# Patient Record
Sex: Female | Born: 1960 | Race: White | Hispanic: No | Marital: Married | Smoking: Never smoker
Health system: Southern US, Community
[De-identification: ages and names within clinical notes are randomized; demographics above are authoritative.]

## PROBLEM LIST (undated history)

## (undated) DIAGNOSIS — M1990 Unspecified osteoarthritis, unspecified site: Secondary | ICD-10-CM

## (undated) DIAGNOSIS — F419 Anxiety disorder, unspecified: Secondary | ICD-10-CM

## (undated) DIAGNOSIS — T7840XA Allergy, unspecified, initial encounter: Secondary | ICD-10-CM

## (undated) DIAGNOSIS — F32A Depression, unspecified: Secondary | ICD-10-CM

## (undated) DIAGNOSIS — I4891 Unspecified atrial fibrillation: Secondary | ICD-10-CM

## (undated) DIAGNOSIS — F329 Major depressive disorder, single episode, unspecified: Secondary | ICD-10-CM

## (undated) DIAGNOSIS — H269 Unspecified cataract: Secondary | ICD-10-CM

## (undated) HISTORY — PX: WRIST SURGERY: SHX841

## (undated) HISTORY — DX: Unspecified atrial fibrillation: I48.91

## (undated) HISTORY — PX: SHOULDER SURGERY: SHX246

## (undated) HISTORY — DX: Anxiety disorder, unspecified: F41.9

## (undated) HISTORY — DX: Unspecified osteoarthritis, unspecified site: M19.90

## (undated) HISTORY — DX: Allergy, unspecified, initial encounter: T78.40XA

## (undated) HISTORY — DX: Unspecified cataract: H26.9

## (undated) HISTORY — DX: Major depressive disorder, single episode, unspecified: F32.9

## (undated) HISTORY — DX: Depression, unspecified: F32.A

## (undated) HISTORY — PX: COSMETIC SURGERY: SHX468

## (undated) HISTORY — PX: KIDNEY STONE SURGERY: SHX686

## (undated) HISTORY — PX: FRACTURE SURGERY: SHX138

---

## 1996-11-05 HISTORY — PX: COLON SURGERY: SHX602

## 2000-02-12 ENCOUNTER — Other Ambulatory Visit: Admission: RE | Admit: 2000-02-12 | Discharge: 2000-02-12 | Payer: Self-pay | Admitting: Obstetrics and Gynecology

## 2000-03-08 ENCOUNTER — Other Ambulatory Visit: Admission: RE | Admit: 2000-03-08 | Discharge: 2000-03-08 | Payer: Self-pay | Admitting: Obstetrics and Gynecology

## 2000-03-08 ENCOUNTER — Encounter (INDEPENDENT_AMBULATORY_CARE_PROVIDER_SITE_OTHER): Payer: Self-pay | Admitting: Specialist

## 2001-03-07 ENCOUNTER — Other Ambulatory Visit: Admission: RE | Admit: 2001-03-07 | Discharge: 2001-03-07 | Payer: Self-pay | Admitting: Obstetrics and Gynecology

## 2002-03-17 ENCOUNTER — Other Ambulatory Visit: Admission: RE | Admit: 2002-03-17 | Discharge: 2002-03-17 | Payer: Self-pay | Admitting: Obstetrics and Gynecology

## 2002-07-14 ENCOUNTER — Encounter: Admission: RE | Admit: 2002-07-14 | Discharge: 2002-07-14 | Payer: Self-pay | Admitting: Family Medicine

## 2002-07-14 ENCOUNTER — Encounter: Payer: Self-pay | Admitting: Family Medicine

## 2003-03-23 ENCOUNTER — Other Ambulatory Visit: Admission: RE | Admit: 2003-03-23 | Discharge: 2003-03-23 | Payer: Self-pay | Admitting: Obstetrics and Gynecology

## 2003-12-22 LAB — HM COLONOSCOPY

## 2004-04-04 ENCOUNTER — Other Ambulatory Visit: Admission: RE | Admit: 2004-04-04 | Discharge: 2004-04-04 | Payer: Self-pay | Admitting: Obstetrics and Gynecology

## 2005-04-19 ENCOUNTER — Other Ambulatory Visit: Admission: RE | Admit: 2005-04-19 | Discharge: 2005-04-19 | Payer: Self-pay | Admitting: Obstetrics and Gynecology

## 2005-06-05 HISTORY — PX: BRAIN SURGERY: SHX531

## 2006-04-23 ENCOUNTER — Other Ambulatory Visit: Admission: RE | Admit: 2006-04-23 | Discharge: 2006-04-23 | Payer: Self-pay | Admitting: Obstetrics and Gynecology

## 2007-03-06 HISTORY — PX: OTHER SURGICAL HISTORY: SHX169

## 2007-04-29 ENCOUNTER — Other Ambulatory Visit: Admission: RE | Admit: 2007-04-29 | Discharge: 2007-04-29 | Payer: Self-pay | Admitting: Obstetrics and Gynecology

## 2007-08-26 ENCOUNTER — Other Ambulatory Visit: Admission: RE | Admit: 2007-08-26 | Discharge: 2007-08-26 | Payer: Self-pay | Admitting: Obstetrics and Gynecology

## 2008-12-01 ENCOUNTER — Encounter (INDEPENDENT_AMBULATORY_CARE_PROVIDER_SITE_OTHER): Payer: Self-pay | Admitting: *Deleted

## 2008-12-14 ENCOUNTER — Ambulatory Visit: Payer: Self-pay | Admitting: Family Medicine

## 2008-12-14 DIAGNOSIS — F3289 Other specified depressive episodes: Secondary | ICD-10-CM | POA: Insufficient documentation

## 2008-12-14 DIAGNOSIS — A6000 Herpesviral infection of urogenital system, unspecified: Secondary | ICD-10-CM | POA: Insufficient documentation

## 2008-12-14 DIAGNOSIS — F329 Major depressive disorder, single episode, unspecified: Secondary | ICD-10-CM

## 2008-12-14 DIAGNOSIS — R143 Flatulence: Secondary | ICD-10-CM

## 2008-12-14 DIAGNOSIS — R141 Gas pain: Secondary | ICD-10-CM

## 2008-12-14 DIAGNOSIS — R142 Eructation: Secondary | ICD-10-CM

## 2008-12-14 DIAGNOSIS — F411 Generalized anxiety disorder: Secondary | ICD-10-CM | POA: Insufficient documentation

## 2008-12-15 ENCOUNTER — Ambulatory Visit: Payer: Self-pay | Admitting: Family Medicine

## 2008-12-15 ENCOUNTER — Telehealth: Payer: Self-pay | Admitting: Family Medicine

## 2008-12-17 ENCOUNTER — Ambulatory Visit: Payer: Self-pay | Admitting: Family Medicine

## 2008-12-17 LAB — CONVERTED CEMR LAB: Potassium: 5.4 meq/L — ABNORMAL HIGH (ref 3.5–5.1)

## 2008-12-18 ENCOUNTER — Inpatient Hospital Stay (HOSPITAL_COMMUNITY): Admission: EM | Admit: 2008-12-18 | Discharge: 2008-12-19 | Payer: Self-pay | Admitting: Emergency Medicine

## 2008-12-18 ENCOUNTER — Ambulatory Visit: Payer: Self-pay | Admitting: Endocrinology

## 2008-12-19 ENCOUNTER — Encounter: Payer: Self-pay | Admitting: Endocrinology

## 2008-12-19 DIAGNOSIS — R079 Chest pain, unspecified: Secondary | ICD-10-CM

## 2008-12-21 ENCOUNTER — Ambulatory Visit: Payer: Self-pay

## 2008-12-21 ENCOUNTER — Encounter: Payer: Self-pay | Admitting: Endocrinology

## 2008-12-21 ENCOUNTER — Encounter: Payer: Self-pay | Admitting: Family Medicine

## 2008-12-22 ENCOUNTER — Encounter (INDEPENDENT_AMBULATORY_CARE_PROVIDER_SITE_OTHER): Payer: Self-pay | Admitting: *Deleted

## 2008-12-24 ENCOUNTER — Encounter: Admission: RE | Admit: 2008-12-24 | Discharge: 2008-12-24 | Payer: Self-pay | Admitting: Family Medicine

## 2008-12-24 DIAGNOSIS — R935 Abnormal findings on diagnostic imaging of other abdominal regions, including retroperitoneum: Secondary | ICD-10-CM

## 2008-12-27 ENCOUNTER — Telehealth (INDEPENDENT_AMBULATORY_CARE_PROVIDER_SITE_OTHER): Payer: Self-pay | Admitting: *Deleted

## 2008-12-27 ENCOUNTER — Encounter (INDEPENDENT_AMBULATORY_CARE_PROVIDER_SITE_OTHER): Payer: Self-pay | Admitting: *Deleted

## 2008-12-28 ENCOUNTER — Ambulatory Visit: Payer: Self-pay | Admitting: Gynecology

## 2009-01-03 ENCOUNTER — Ambulatory Visit: Payer: Self-pay | Admitting: Cardiology

## 2009-01-03 ENCOUNTER — Encounter: Payer: Self-pay | Admitting: Cardiology

## 2009-01-04 DIAGNOSIS — R002 Palpitations: Secondary | ICD-10-CM | POA: Insufficient documentation

## 2009-01-14 ENCOUNTER — Ambulatory Visit: Payer: Self-pay | Admitting: Family Medicine

## 2009-01-14 ENCOUNTER — Telehealth (INDEPENDENT_AMBULATORY_CARE_PROVIDER_SITE_OTHER): Payer: Self-pay | Admitting: *Deleted

## 2009-01-19 ENCOUNTER — Telehealth (INDEPENDENT_AMBULATORY_CARE_PROVIDER_SITE_OTHER): Payer: Self-pay | Admitting: *Deleted

## 2009-04-14 ENCOUNTER — Encounter: Payer: Self-pay | Admitting: Gynecology

## 2009-04-14 ENCOUNTER — Other Ambulatory Visit: Admission: RE | Admit: 2009-04-14 | Discharge: 2009-04-14 | Payer: Self-pay | Admitting: Gynecology

## 2009-04-14 ENCOUNTER — Ambulatory Visit: Payer: Self-pay | Admitting: Gynecology

## 2009-04-15 ENCOUNTER — Encounter: Payer: Self-pay | Admitting: Gynecology

## 2009-04-15 ENCOUNTER — Ambulatory Visit: Payer: Self-pay | Admitting: Gynecology

## 2009-04-15 ENCOUNTER — Ambulatory Visit (HOSPITAL_BASED_OUTPATIENT_CLINIC_OR_DEPARTMENT_OTHER): Admission: RE | Admit: 2009-04-15 | Discharge: 2009-04-15 | Payer: Self-pay | Admitting: Gynecology

## 2009-04-19 ENCOUNTER — Ambulatory Visit: Payer: Self-pay | Admitting: Family Medicine

## 2009-07-29 ENCOUNTER — Encounter: Payer: Self-pay | Admitting: Family Medicine

## 2009-07-29 DIAGNOSIS — K5190 Ulcerative colitis, unspecified, without complications: Secondary | ICD-10-CM | POA: Insufficient documentation

## 2009-09-23 ENCOUNTER — Encounter: Payer: Self-pay | Admitting: Family Medicine

## 2009-09-23 DIAGNOSIS — I491 Atrial premature depolarization: Secondary | ICD-10-CM | POA: Insufficient documentation

## 2009-09-23 DIAGNOSIS — I471 Supraventricular tachycardia, unspecified: Secondary | ICD-10-CM | POA: Insufficient documentation

## 2009-10-07 ENCOUNTER — Ambulatory Visit: Payer: Self-pay | Admitting: Family Medicine

## 2009-10-07 DIAGNOSIS — B373 Candidiasis of vulva and vagina: Secondary | ICD-10-CM

## 2009-10-07 DIAGNOSIS — R7309 Other abnormal glucose: Secondary | ICD-10-CM | POA: Insufficient documentation

## 2009-10-10 LAB — CONVERTED CEMR LAB
Calcium: 9.1 mg/dL (ref 8.4–10.5)
Creatinine, Ser: 0.7 mg/dL (ref 0.4–1.2)
GFR calc non Af Amer: 94.96 mL/min (ref >60)
Hgb A1c MFr Bld: 5.4 % (ref 4.6–6.5)
Sodium: 141 meq/L (ref 135–145)

## 2010-04-14 ENCOUNTER — Ambulatory Visit: Payer: Self-pay | Admitting: Family Medicine

## 2010-04-19 LAB — CONVERTED CEMR LAB: Pap Smear: NORMAL

## 2010-04-19 LAB — HM PAP SMEAR

## 2010-04-27 ENCOUNTER — Other Ambulatory Visit: Admission: RE | Admit: 2010-04-27 | Discharge: 2010-04-27 | Payer: Self-pay | Admitting: Gynecology

## 2010-04-27 ENCOUNTER — Encounter: Payer: Self-pay | Admitting: Family Medicine

## 2010-04-27 ENCOUNTER — Ambulatory Visit: Payer: Self-pay | Admitting: Gynecology

## 2010-10-13 ENCOUNTER — Ambulatory Visit: Payer: Self-pay | Admitting: Family Medicine

## 2010-10-13 ENCOUNTER — Encounter: Payer: Self-pay | Admitting: Family Medicine

## 2010-10-13 LAB — CONVERTED CEMR LAB
Blood in Urine, dipstick: NEGATIVE
Glucose, Urine, Semiquant: NEGATIVE
Protein, U semiquant: NEGATIVE
WBC Urine, dipstick: NEGATIVE
pH: 5

## 2010-10-16 LAB — CONVERTED CEMR LAB
Alkaline Phosphatase: 60 units/L (ref 39–117)
BUN: 10 mg/dL (ref 6–23)
Basophils Absolute: 0 10*3/uL (ref 0.0–0.1)
Bilirubin, Direct: 0.1 mg/dL (ref 0.0–0.3)
CO2: 24 meq/L (ref 19–32)
Calcium: 9.7 mg/dL (ref 8.4–10.5)
Chloride: 106 meq/L (ref 96–112)
Cholesterol: 167 mg/dL (ref 0–200)
Creatinine, Ser: 0.8 mg/dL (ref 0.4–1.2)
Eosinophils Absolute: 0.2 10*3/uL (ref 0.0–0.7)
Glucose, Bld: 92 mg/dL (ref 70–99)
HDL: 75.6 mg/dL (ref >39.00)
Hgb A1c MFr Bld: 5.5 % (ref 4.6–6.5)
Lymphocytes Relative: 20.9 % (ref 12.0–46.0)
MCHC: 34.3 g/dL (ref 30.0–36.0)
MCV: 93.6 fL (ref 78.0–100.0)
Monocytes Absolute: 0.4 10*3/uL (ref 0.1–1.0)
Neutrophils Relative %: 71.2 % (ref 43.0–77.0)
Platelets: 262 10*3/uL (ref 150.0–400.0)
RDW: 12.9 % (ref 11.5–14.6)
Total CHOL/HDL Ratio: 2
Total Protein: 7.3 g/dL (ref 6.0–8.3)
Triglycerides: 53 mg/dL (ref 0.0–149.0)

## 2010-12-03 LAB — CONVERTED CEMR LAB
ALT: 30 units/L (ref 0–35)
Basophils Absolute: 0 10*3/uL (ref 0.0–0.1)
Basophils Relative: 0.1 % (ref 0.0–3.0)
Bilirubin, Direct: 0.1 mg/dL (ref 0.0–0.3)
CO2: 26 meq/L (ref 19–32)
Calcium: 10 mg/dL (ref 8.4–10.5)
Cholesterol: 183 mg/dL (ref 0–200)
Creatinine, Ser: 1 mg/dL (ref 0.4–1.2)
GFR calc Af Amer: 76 mL/min
Glucose, Bld: 104 mg/dL — ABNORMAL HIGH (ref 70–99)
HCT: 43.1 % (ref 36.0–46.0)
Hemoglobin: 14.8 g/dL (ref 12.0–15.0)
Lymphocytes Relative: 21.5 % (ref 12.0–46.0)
MCHC: 34.4 g/dL (ref 30.0–36.0)
Monocytes Absolute: 0.4 10*3/uL (ref 0.1–1.0)
Monocytes Relative: 5.1 % (ref 3.0–12.0)
Neutro Abs: 6.3 10*3/uL (ref 1.4–7.7)
RBC: 4.58 M/uL (ref 3.87–5.11)
RDW: 12.6 % (ref 11.5–14.6)
Sodium: 145 meq/L (ref 135–145)
TSH: 1.56 microintl units/mL (ref 0.35–5.50)
Total Protein: 7.6 g/dL (ref 6.0–8.3)
Triglycerides: 65 mg/dL (ref 0–149)

## 2010-12-07 NOTE — Assessment & Plan Note (Signed)
Summary: cpx & lab/cbs   Vital Signs:  Patient profile:   50 year old female Height:      6.25 inches Weight:      143.8 pounds Temp:     99.0 degrees F oral Pulse rate:   78 / minute Pulse rhythm:   regular BP sitting:   128 / 86  (left arm) Cuff size:   regular  Vitals Entered By: Almeta Monas CMA Duncan Dull) (October 13, 2010 9:00 AM)  Serial Vital Signs/Assessments:  Time      Position  BP       Pulse  Resp  Temp     By 9:12 AM             122/76                         Almeta Monas CMA (AAMA)  CC: CPX/fasting, no pap needed, mammogram scheduled for today   History of Present Illness: Pt here for cpe and labs.   Mammogram to be done today.   Pap done by Dr Audie Box.    Preventive Screening-Counseling & Management  Alcohol-Tobacco     Alcohol drinks/day: <1     Alcohol type: 2 glasses of wine 3 times week     Smoking Status: never     Passive Smoke Exposure: yes  Caffeine-Diet-Exercise     Caffeine use/day: 2     Does Patient Exercise: yes     Type of exercise: treadmill     Times/week: 3  Current Medications (verified): 1)  Jolessa 0.15-0.03 Mg Tabs (Levonorgest-Eth Estrad 91-Day) .... As Directed 2)  Klonopin 1 Mg Tabs (Clonazepam) .... 1/2 -1 By Mouth At Bedtime 3)  Cymbalta 30 Mg Cpep (Duloxetine Hcl) .Marland Kitchen.. 1 By Mouth Once Daily 4)  Wellbutrin Xl 300 Mg Xr24h-Tab (Bupropion Hcl) .Marland Kitchen.. 1 By Mouth Once Daily 5)  Multivitamins  Tabs (Multiple Vitamin) 6)  Calcium 1500 Mg Tabs (Calcium Carbonate) 7)  Zango Juice .Marland Kitchen.. 1 Ounce Per Day 8)  Atenolol 25 Mg Tabs (Atenolol) .... As Needed 9)  Flagyl 250 Mg Tabs (Metronidazole) .Marland Kitchen.. 1 By Mouth Once Daily For Pouchitis  Allergies (verified): 1)  ! Codeine 2)  ! Vicodin  Past History:  Past Medical History: Last updated: 12/14/2008 problems with colon bowel problems ulcerative colitis breast augmentation in august 2006 Depression  Past Surgical History: Last updated: 12/14/2008 1998-colon removed May  1998-iliostemy June 1998-emergency surgery had adhesions wrapped around small intestines  Family History: Last updated: 12/14/2008 Father passed in 2000- multiple myeloma palchitis-patient inflammation of small intestines mother-stage 4 ovarian cancer, suffers from DDD, emphysema, COPD, hypothyroidism, tendonitis, HBP, morbidly obese, depression, diabetes, anxiety,chronic pain  Social History: Last updated: 12/14/2008 Occupation: just lost her job-unemployed Divorced Never Smoked Alcohol use-yes Drug use-no Regular exercise-yes  Risk Factors: Alcohol Use: <1 (10/13/2010) Caffeine Use: 2 (10/13/2010) Exercise: yes (10/13/2010)  Risk Factors: Smoking Status: never (10/13/2010) Passive Smoke Exposure: yes (10/13/2010)  Family History: Reviewed history from 12/14/2008 and no changes required. Father passed in 2000- multiple myeloma palchitis-patient inflammation of small intestines mother-stage 4 ovarian cancer, suffers from DDD, emphysema, COPD, hypothyroidism, tendonitis, HBP, morbidly obese, depression, diabetes, anxiety,chronic pain  Social History: Reviewed history from 12/14/2008 and no changes required. Occupation: just lost her job-unemployed Divorced Never Smoked Alcohol use-yes Drug use-no Regular exercise-yes  Review of Systems      See HPI General:  Denies chills, fatigue, fever, loss of appetite, malaise, sleep disorder, sweats,  weakness, and weight loss. Eyes:  Denies blurring, discharge, double vision, eye irritation, eye pain, halos, itching, light sensitivity, red eye, vision loss-1 eye, and vision loss-both eyes. ENT:  Denies decreased hearing, difficulty swallowing, ear discharge, earache, hoarseness, nasal congestion, nosebleeds, postnasal drainage, ringing in ears, sinus pressure, and sore throat. CV:  Denies bluish discoloration of lips or nails, chest pain or discomfort, difficulty breathing at night, difficulty breathing while lying down, fainting,  fatigue, leg cramps with exertion, lightheadness, near fainting, palpitations, shortness of breath with exertion, swelling of feet, swelling of hands, and weight gain. Resp:  Denies chest discomfort, chest pain with inspiration, cough, coughing up blood, excessive snoring, hypersomnolence, morning headaches, pleuritic, shortness of breath, sputum productive, and wheezing. GI:  Denies abdominal pain, bloody stools, change in bowel habits, constipation, dark tarry stools, diarrhea, excessive appetite, gas, hemorrhoids, indigestion, loss of appetite, nausea, vomiting, vomiting blood, and yellowish skin color. GU:  Denies abnormal vaginal bleeding, decreased libido, discharge, dysuria, genital sores, hematuria, incontinence, nocturia, urinary frequency, and urinary hesitancy. MS:  Denies joint pain, joint redness, joint swelling, loss of strength, low back pain, mid back pain, muscle aches, muscle , cramps, muscle weakness, stiffness, and thoracic pain. Derm:  Denies changes in color of skin, changes in nail beds, dryness, excessive perspiration, flushing, hair loss, insect bite(s), itching, lesion(s), poor wound healing, and rash. Neuro:  Denies brief paralysis, difficulty with concentration, disturbances in coordination, falling down, headaches, inability to speak, memory loss, numbness, poor balance, seizures, sensation of room spinning, tingling, tremors, visual disturbances, and weakness. Psych:  Denies alternate hallucination ( auditory/visual), anxiety, depression, easily angered, easily tearful, irritability, mental problems, panic attacks, sense of great danger, suicidal thoughts/plans, thoughts of violence, unusual visions or sounds, and thoughts /plans of harming others. Endo:  Denies cold intolerance, excessive hunger, excessive thirst, excessive urination, heat intolerance, polyuria, and weight change. Heme:  Denies abnormal bruising, bleeding, enlarge lymph nodes, fevers, pallor, and skin  discoloration. Allergy:  Denies hives or rash, itching eyes, persistent infections, seasonal allergies, and sneezing.  Physical Exam  General:  Well-developed,well-nourished,in no acute distress; alert,appropriate and cooperative throughout examination Head:  Normocephalic and atraumatic without obvious abnormalities. No apparent alopecia or balding. Eyes:  vision grossly intact, pupils equal, pupils round, pupils reactive to light, and no injection.   Ears:  External ear exam shows no significant lesions or deformities.  Otoscopic examination reveals clear canals, tympanic membranes are intact bilaterally without bulging, retraction, inflammation or discharge. Hearing is grossly normal bilaterally. Nose:  External nasal examination shows no deformity or inflammation. Nasal mucosa are pink and moist without lesions or exudates. Mouth:  Oral mucosa and oropharynx without lesions or exudates.  Teeth in good repair. Neck:  No deformities, masses, or tenderness noted. Chest Wall:  No deformities, masses, or tenderness noted. Breasts:  gyn Lungs:  Normal respiratory effort, chest expands symmetrically. Lungs are clear to auscultation, no crackles or wheezes. Heart:  normal rate and no murmur.   Abdomen:  Bowel sounds positive,abdomen soft and non-tender without masses, organomegaly or hernias noted. Rectal:  gyn Genitalia:  gyn Msk:  normal ROM, no joint tenderness, no joint swelling, no joint warmth, no redness over joints, no joint deformities, no joint instability, and no crepitation.   Pulses:  R and L carotid,radial,femoral,dorsalis pedis and posterior tibial pulses are full and equal bilaterally Extremities:  No clubbing, cyanosis, edema, or deformity noted with normal full range of motion of all joints.   Neurologic:  No cranial nerve deficits noted.  Station and gait are normal. Plantar reflexes are down-going bilaterally. DTRs are symmetrical throughout. Sensory, motor and coordinative  functions appear intact. Skin:  Intact without suspicious lesions or rashes Cervical Nodes:  No lymphadenopathy noted Axillary Nodes:  No palpable lymphadenopathy Psych:  Cognition and judgment appear intact. Alert and cooperative with normal attention span and concentration. No apparent delusions, illusions, hallucinations   Impression & Recommendations:  Problem # 1:  PREVENTIVE HEALTH CARE (ICD-V70.0) check fasting labs ghm utd  Orders: TLB-TSH (Thyroid Stimulating Hormone) (84443-TSH) TLB-A1C / Hgb A1C (Glycohemoglobin) (83036-A1C) UA Dipstick w/o Micro (manual) ([address])  Problem # 2:  HYPERGLYCEMIA (ICD-790.29)  Orders: Venipuncture ([address]) TLB-Lipid Panel (80061-LIPID) TLB-BMP (Basic Metabolic Panel-BMET) (80048-METABOL) TLB-CBC Platelet - w/Differential (85025-CBCD) TLB-Hepatic/Liver Function Pnl (80076-HEPATIC) TLB-TSH (Thyroid Stimulating Hormone) (84443-TSH) TLB-A1C / Hgb A1C (Glycohemoglobin) (83036-A1C) Specimen Handling (99000)  Labs Reviewed: Creat: 0.7 (10/07/2009)     Problem # 3:  ANXIETY STATE, UNSPECIFIED (ICD-300.00) Assessment: Improved  improved---pt would like to wean off meds---- wean off cymbalta first  Her updated medication list for this problem includes:    Klonopin 1 Mg Tabs (Clonazepam) .Marland Kitchen... 1/2 -1 by mouth at bedtime    Cymbalta 30 Mg Cpep (Duloxetine hcl) .Marland Kitchen... 1 by mouth once daily    Wellbutrin Xl 300 Mg Xr24h-tab (Bupropion hcl) .Marland Kitchen... 1 by mouth once daily  Discussed medication use and relaxation techniques.   Complete Medication List: 1)  Jolessa 0.15-0.03 Mg Tabs (Levonorgest-eth estrad 91-day) .... As directed 2)  Klonopin 1 Mg Tabs (Clonazepam) .... 1/2 -1 by mouth at bedtime 3)  Cymbalta 30 Mg Cpep (Duloxetine hcl) .Marland Kitchen.. 1 by mouth once daily 4)  Wellbutrin Xl 300 Mg Xr24h-tab (Bupropion hcl) .Marland Kitchen.. 1 by mouth once daily 5)  Multivitamins Tabs (Multiple vitamin) 6)  Calcium 1500 Mg Tabs (Calcium carbonate) 7)  Zango Juice  .Marland Kitchen.. 1  ounce per day 8)  Atenolol 25 Mg Tabs (Atenolol) .... As needed 9)  Flagyl 250 Mg Tabs (Metronidazole) .Marland Kitchen.. 1 by mouth once daily for pouchitis  Patient Instructions: 1)  Please schedule a follow-up appointment in 6 months .  Prescriptions: KLONOPIN 1 MG TABS (CLONAZEPAM) 1/2 -1 by mouth at bedtime  #30 x 1   Entered and Authorized by:   Loreen Freud DO   Signed by:   Loreen Freud DO on 10/13/2010   Method used:   Print then Give to Patient   RxID:   [id number] WELLBUTRIN XL 300 MG XR24H-TAB (BUPROPION HCL) 1 by mouth once daily  #30 x 5   Entered and Authorized by:   Loreen Freud DO   Signed by:   Loreen Freud DO on 10/13/2010   Method used:   Electronically to        [id number]* (retail)       [address]       [place], [place]  [address]       Ph: [phone] or [phone]       Fax: [phone]   RxID:   [id number] CYMBALTA 30 MG CPEP (DULOXETINE HCL) 1 by mouth once daily  #30 x 2   Entered and Authorized by:   Loreen Freud DO   Signed by:   Loreen Freud DO on 10/13/2010   Method used:   Electronically to        [id number]* (retail)       [address]  [place], [place]  [address]       Ph: [phone] or [phone]       Fax: [phone]   RxID:   [id number]    Orders Added: 1)  Venipuncture [27253] 2)  TLB-Lipid Panel [80061-LIPID] 3)  TLB-BMP (Basic Metabolic Panel-BMET) [80048-METABOL] 4)  TLB-CBC Platelet - w/Differential [85025-CBCD] 5)  TLB-Hepatic/Liver Function Pnl [80076-HEPATIC] 6)  TLB-TSH (Thyroid Stimulating Hormone) [84443-TSH] 7)  TLB-A1C / Hgb A1C (Glycohemoglobin) [83036-A1C] 8)  Est. Patient 40-64 years [99396] 9)  Specimen Handling [99000] 10)  UA Dipstick w/o Micro (manual) [81002]    Flu Vaccine Next Due:  Refused Last PAP:  Normal (03/17/2008 10:58:14 AM) PAP Result Date:  04/19/2010 PAP Result:  normal PAP Next Due:  1  yr    Laboratory Results   Urine Tests   Date/Time Reported: October 13, 2010 10:25 AM   Routine Urinalysis   Color: yellow Appearance: Clear Glucose: negative   (Normal Range: Negative) Bilirubin: negative   (Normal Range: Negative) Ketone: smal (15)   (Normal Range: Negative) Spec. Gravity: 1.025   (Normal Range: 1.003-1.035) Blood: negative   (Normal Range: Negative) pH: 5.0   (Normal Range: 5.0-8.0) Protein: negative   (Normal Range: Negative) Urobilinogen: negative   (Normal Range: 0-1) Nitrite: negative   (Normal Range: Negative) Leukocyte Esterace: negative   (Normal Range: Negative)    Comments: Floydene Flock  October 13, 2010 10:25 AM

## 2010-12-07 NOTE — Assessment & Plan Note (Signed)
Summary: 6 mo. f/u - jr   Vital Signs:  Patient profile:   50 year old female Height:      68 inches Weight:      144 pounds BMI:     21.97 Pulse rate:   82 / minute Pulse rhythm:   regular BP sitting:   122 / 80  (left arm) Cuff size:   regular  Vitals Entered By: Army Fossa CMA (April 14, 2010 3:12 PM) CC: Pt here for 6 month follow up on meds, Depressive symptoms   History of Present Illness:  Depressive symptoms      This is a 50 year old woman who presents with Depressive symptoms.  The patient denies depressed mood, loss of interest/pleasure, significant weight loss, significant weight gain, insomnia, hypersomnia, psychomotor agitation, and psychomotor retardation.  The patient denies fatigue or loss of energy, feelings of worthlessness, diminished concentration, indecisiveness, thoughts of death, thoughts of suicide, suicidal intent, and suicidal plans.  The patient reports the following psychosocial stressors: recent death of a loved one.  Patient's past history includes depression.  The patient denies abnormally elevated mood, abnormally irritable mood, decreased need for sleep, increased talkativeness, distractibility, flight of ideas, increased goal-directed activity, and inflated self-esteem/ grandiosity.    Current Medications (verified): 1)  Jolessa 0.15-0.03 Mg Tabs (Levonorgest-Eth Estrad 91-Day) .... As Directed 2)  Metronidazole 500 Mg Tabs (Metronidazole) .... Takes 1/2 Tablet Daily 3)  Klonopin 1 Mg Tabs (Clonazepam) .... 1/2 -1 By Mouth At Bedtime 4)  Cymbalta 60 Mg Cpep (Duloxetine Hcl) .Marland Kitchen.. 1 By Mouth Once Daily 5)  Wellbutrin Xl 300 Mg Xr24h-Tab (Bupropion Hcl) .Marland Kitchen.. 1 By Mouth Once Daily 6)  Fluconazole 150 Mg Tabs (Fluconazole) .Marland Kitchen.. 1 By Mouth Once Daily X1 --May Repeat in 1 Month As Needed 7)  Multivitamins  Tabs (Multiple Vitamin) 8)  Calcium 1500 Mg Tabs (Calcium Carbonate) 9)  Fish Oil 300 Mg Caps (Omega-3 Fatty Acids)  Allergies: 1)  ! Codeine 2)   ! Vicodin  Past History:  Past medical, surgical, family and social histories (including risk factors) reviewed for relevance to current acute and chronic problems.  Past Medical History: Reviewed history from 12/14/2008 and no changes required. problems with colon bowel problems ulcerative colitis breast augmentation in august 2006 Depression  Past Surgical History: Reviewed history from 12/14/2008 and no changes required. 1998-colon removed May 1998-iliostemy June 1998-emergency surgery had adhesions wrapped around small intestines  Family History: Reviewed history from 12/14/2008 and no changes required. Father passed in 2000- multiple myeloma palchitis-patient inflammation of small intestines mother-stage 4 ovarian cancer, suffers from DDD, emphysema, COPD, hypothyroidism, tendonitis, HBP, morbidly obese, depression, diabetes, anxiety,chronic pain  Social History: Reviewed history from 12/14/2008 and no changes required. Occupation: just lost her job-unemployed Divorced Never Smoked Alcohol use-yes Drug use-no Regular exercise-yes  Review of Systems      See HPI  Physical Exam  General:  Well-developed,well-nourished,in no acute distress; alert,appropriate and cooperative throughout examination Psych:  Oriented X3, normally interactive, good eye contact, not anxious appearing, and not depressed appearing.     Impression & Recommendations:  Problem # 1:  DEPRESSION (ICD-311) Assessment Improved  Her updated medication list for this problem includes:    Klonopin 1 Mg Tabs (Clonazepam) .Marland Kitchen... 1/2 -1 by mouth at bedtime    Cymbalta 60 Mg Cpep (Duloxetine hcl) .Marland Kitchen... 1 by mouth once daily    Wellbutrin Xl 300 Mg Xr24h-tab (Bupropion hcl) .Marland Kitchen... 1 by mouth once daily  Complete Medication List: 1)  Jolessa 0.15-0.03 Mg Tabs (Levonorgest-eth estrad 91-day) .... As directed 2)  Metronidazole 500 Mg Tabs (Metronidazole) .... Takes 1/2 tablet daily 3)  Klonopin 1 Mg  Tabs (Clonazepam) .... 1/2 -1 by mouth at bedtime 4)  Cymbalta 60 Mg Cpep (Duloxetine hcl) .Marland Kitchen.. 1 by mouth once daily 5)  Wellbutrin Xl 300 Mg Xr24h-tab (Bupropion hcl) .Marland Kitchen.. 1 by mouth once daily 6)  Fluconazole 150 Mg Tabs (Fluconazole) .Marland Kitchen.. 1 by mouth once daily x1 --may repeat in 1 month as needed 7)  Multivitamins Tabs (Multiple vitamin) 8)  Calcium 1500 Mg Tabs (Calcium carbonate) 9)  Fish Oil 300 Mg Caps (Omega-3 fatty acids)  Patient Instructions: 1)  Please schedule a follow-up appointment in 6 months for cpe Prescriptions: KLONOPIN 1 MG TABS (CLONAZEPAM) 1/2 -1 by mouth at bedtime  #30 x 1   Entered and Authorized by:   Loreen Freud DO   Signed by:   Loreen Freud DO on 04/14/2010   Method used:   Print then Give to Patient   RxID:   [id number] WELLBUTRIN XL 300 MG XR24H-TAB (BUPROPION HCL) 1 by mouth once daily  #30 x 5   Entered and Authorized by:   Loreen Freud DO   Signed by:   Loreen Freud DO on 04/14/2010   Method used:   Electronically to        [id number]* (retail)       [address]       [place], [place]  [address]       Ph: [phone] or [phone]       Fax: [phone]   RxID:   [id number] CYMBALTA 60 MG CPEP (DULOXETINE HCL) 1 by mouth once daily  #30 x 5   Entered and Authorized by:   Loreen Freud DO   Signed by:   Loreen Freud DO on 04/14/2010   Method used:   Electronically to        [id number] (retail)       [address]       [place], [place]  [address]       Ph: [phone] or [phone]       Fax: [phone]   RxID:   [id number]

## 2010-12-07 NOTE — Letter (Signed)
Summary: Cancer Screening/[facility] Personalized Risk Profile  Cancer Screening/[facility] Personalized Risk Profile   Imported By: Lanelle Bal 10/18/2010 13:01:39  _____________________________________________________________________  External Attachment:    Type:   Image     Comment:   External Document

## 2010-12-07 NOTE — Letter (Signed)
Summary: [facility]  [facility]   Imported By: Lanelle Bal 05/16/2010 09:56:32  _____________________________________________________________________  External Attachment:    Type:   Image     Comment:   External Document

## 2010-12-15 ENCOUNTER — Telehealth (INDEPENDENT_AMBULATORY_CARE_PROVIDER_SITE_OTHER): Payer: Self-pay | Admitting: *Deleted

## 2010-12-19 ENCOUNTER — Encounter: Payer: Self-pay | Admitting: Family Medicine

## 2011-01-02 NOTE — Progress Notes (Signed)
Summary: -Prior Auth APPROVED CYMBALTA COVENTRY  Phone Note Refill Request Call back at [phone] Message from:  Pharmacy on December 15, 2010 2:08 PM  Refills Requested: Medication #1:  CYMBALTA 30 MG CPEP 1 by mouth once daily   Dosage confirmed as above?Dosage Confirmed Prior Auth from [employer] [facility] on [address]  Switch to a preferred. (handwritten note)  Initial call taken by: Harold Barban,  December 15, 2010 2:08 PM  Follow-up for Phone Call        Preferred med: celexa, effexor, lexapro, paxil, prozac. Pls advise if any of these med would be appropriate if not PA will be needed to try to get med approved.Marti Sleigh Deloach CMA  December 18, 2010 5:04 PM   Additional Follow-up for Phone Call Additional follow up Details #1::        none of these is equivalent to cymbalta----ask pt if she has tried any in the past and let her know what ins has sent.  Prior auth if she doen't want to change.   Additional Follow-up by: Loreen Freud DO,  December 19, 2010 9:44 AM    Additional Follow-up for Phone Call Additional follow up Details #2::    Left message to call office .Marland KitchenMarland KitchenFelecia Deloach CMA  December 19, 2010 10:31 AM   Pt states that the only med she has tried was zoloft and it made her very sleepy and it really did not help her symptoms. Advise Pt would try to get PA approved.........Marland KitchenFelecia Deloach CMA  December 19, 2010 1:04 PM   awaiting fax............Marland KitchenFelecia Deloach CMA  December 19, 2010 4:00 PM  [phone]  PA faxed back awaiting response.........Marland KitchenFelecia Deloach CMA  December 19, 2010 4:57 PM   Spoke with PA agent still in process should have decision today.Marti Sleigh Deloach CMA  December 22, 2010 9:57 AM   Prior Auth approved from 12-19-10 until 12-21-12, pharmacy and Pt notified.......Marland KitchenFelecia Deloach CMA  December 25, 2010 9:24 AM

## 2011-01-02 NOTE — Medication Information (Signed)
Summary: PA & Authorization for Cymbalta  PA & Authorization for Cymbalta   Imported By: Maryln Gottron 12/28/2010 09:47:54  _____________________________________________________________________  External Attachment:    Type:   Image     Comment:   External Document

## 2011-01-11 ENCOUNTER — Ambulatory Visit (INDEPENDENT_AMBULATORY_CARE_PROVIDER_SITE_OTHER): Payer: No Typology Code available for payment source | Admitting: Family Medicine

## 2011-01-11 ENCOUNTER — Encounter: Payer: Self-pay | Admitting: Family Medicine

## 2011-01-11 DIAGNOSIS — J309 Allergic rhinitis, unspecified: Secondary | ICD-10-CM

## 2011-01-16 NOTE — Assessment & Plan Note (Signed)
Summary: cold and cough for 11 days, needs more cymbalta samples///sph   Vital Signs:  Patient profile:   50 year old female Height:      62.5 inches (15.88 cm) Weight:      147 pounds (66.82 kg) BMI:     26.55 Temp:     98.5 degrees F (36.94 degrees C) oral BP sitting:   108 / 64  (left arm) Cuff size:   regular  Vitals Entered By: Lucious Groves CMA (January 11, 2011 2:29 PM) CC: C/O cold and cough x11 days./kb Is Patient Diabetic? No Pain Assessment Patient in pain? no      Comments Patient notes that she has been having post nasal drip, scratch throat, and dry cough. Patient notes that the cough causes a "dry tickle". Patient denies fever, HA, SOB, mucous production, and chest pain. Patient also needs samples of Cymbalta due to cost.    History of Present Illness: 50 yo woman here today for cough.  sxs started w/ scratchy throat, PND (allergy sxs).  sxs started 12 days ago.  cough is dry.  keeping pt awake at night.  not taking anything for allergies.  no fevers, ear pain, facial pain/pressure.  Current Medications (verified): 1)  Jolessa 0.15-0.03 Mg Tabs (Levonorgest-Eth Estrad 91-Day) .... As Directed 2)  Klonopin 1 Mg Tabs (Clonazepam) .... 1/2 -1 By Mouth At Bedtime 3)  Cymbalta 30 Mg Cpep (Duloxetine Hcl) .Marland Kitchen.. 1 By Mouth Once Daily 4)  Wellbutrin Xl 300 Mg Xr24h-Tab (Bupropion Hcl) .Marland Kitchen.. 1 By Mouth Once Daily 5)  Multivitamins  Tabs (Multiple Vitamin) 6)  Calcium 1500 Mg Tabs (Calcium Carbonate) 7)  Zango Juice .Marland Kitchen.. 1 Ounce Per Day 8)  Flagyl 250 Mg Tabs (Metronidazole) .Marland Kitchen.. 1 By Mouth Once Daily For Pouchitis  Allergies (verified): 1)  ! Codeine 2)  ! Vicodin  Review of Systems      See HPI  Physical Exam  General:  Well-developed,well-nourished,in no acute distress; alert,appropriate and cooperative throughout examination Head:  NCAT, no TTP over sinuses Eyes:  no injxn or inflammation Ears:  External ear exam shows no significant lesions or deformities.   Otoscopic examination reveals clear canals, tympanic membranes are intact bilaterally without bulging, retraction, inflammation or discharge. Hearing is grossly normal bilaterally. Nose:  mild turbinate edema Mouth:  copious PND Neck:  No deformities, masses, or tenderness noted. Lungs:  Normal respiratory effort, chest expands symmetrically. Lungs are clear to auscultation, no crackles or wheezes.  dry cough Heart:  normal rate and no murmur.     Impression & Recommendations:  Problem # 1:  RHINITIS (ICD-477.9) Assessment New pt's cough is most likely due to untreated seasonal allergies.  start nasal spray, OTC antihistamine.  reviewed supportive care and red flags that should prompt return.  Pt expresses understanding and is in agreement w/ this plan. Her updated medication list for this problem includes:    Fluticasone Propionate 50 Mcg/act Susp (Fluticasone propionate) .Marland Kitchen... 2 sprays each nostril once daily  Complete Medication List: 1)  Jolessa 0.15-0.03 Mg Tabs (Levonorgest-eth estrad 91-day) .... As directed 2)  Klonopin 1 Mg Tabs (Clonazepam) .... 1/2 -1 by mouth at bedtime 3)  Cymbalta 30 Mg Cpep (Duloxetine hcl) .Marland Kitchen.. 1 by mouth once daily 4)  Wellbutrin Xl 300 Mg Xr24h-tab (Bupropion hcl) .Marland Kitchen.. 1 by mouth once daily 5)  Multivitamins Tabs (Multiple vitamin) 6)  Calcium 1500 Mg Tabs (Calcium carbonate) 7)  Zango Juice  .Marland Kitchen.. 1 ounce per day 8)  Flagyl 250  Mg Tabs (Metronidazole) .Marland Kitchen.. 1 by mouth once daily for pouchitis 9)  Fluticasone Propionate 50 Mcg/act Susp (Fluticasone propionate) .... 2 sprays each nostril once daily 10)  Tessalon 200 Mg Caps (Benzonatate) .... Take one capsule by mouth three times a day as needed for cough  Patient Instructions: 1)  This appears to be a post nasal drip cough 2)  Start the nasal spray as directed to decrease the drainage and in turn, the cough 3)  Add OTC Claritin or Zyrtec as needed for your allergy symptoms 4)  Use the Tessalon for  cough relief 5)  Drink plenty of fluids 6)  Hang in there!!! Prescriptions: TESSALON 200 MG CAPS (BENZONATATE) Take one capsule by mouth three times a day as needed for cough  #60 x 0   Entered and Authorized by:   Neena Rhymes MD   Signed by:   Neena Rhymes MD on 01/11/2011   Method used:   Electronically to        [facility]  [address] [id number]* (retail)       [address]       [place], [place]  [address]       Ph: [phone] or [phone]       Fax: [phone]   RxID:   [id number] FLUTICASONE PROPIONATE 50 MCG/ACT  SUSP (FLUTICASONE PROPIONATE) 2 sprays each nostril once daily  #1 vial x 3   Entered and Authorized by:   Neena Rhymes MD   Signed by:   Neena Rhymes MD on 01/11/2011   Method used:   Electronically to        [facility]  [address] [id number]* (retail)       [address]       [place], [place]  [address]       Ph: [phone] or [phone]       Fax: [phone]   RxID:   [id number]    Orders Added: 1)  Est. Patient Level III [17616]

## 2011-02-20 LAB — CBC
Hemoglobin: 14.3 g/dL (ref 12.0–15.0)
MCHC: 35.1 g/dL (ref 30.0–36.0)
RBC: 4.44 MIL/uL (ref 3.87–5.11)
WBC: 10.6 10*3/uL — ABNORMAL HIGH (ref 4.0–10.5)

## 2011-02-20 LAB — CARDIAC PANEL(CRET KIN+CKTOT+MB+TROPI)
CK, MB: 0.7 ng/mL (ref 0.3–4.0)
Relative Index: INVALID (ref 0.0–2.5)
Total CK: 67 U/L (ref 7–177)
Troponin I: <0.01 ng/mL (ref 0.00–0.06)

## 2011-02-20 LAB — D-DIMER, QUANTITATIVE: D-Dimer, Quant: <0.22 ug/mL-FEU (ref 0.00–0.48)

## 2011-02-20 LAB — POCT CARDIAC MARKERS
Myoglobin, poc: 43.4 ng/mL (ref 12–200)
Troponin i, poc: <0.05 ng/mL (ref 0.00–0.09)

## 2011-02-20 LAB — BASIC METABOLIC PANEL
CO2: 23 mEq/L (ref 19–32)
Calcium: 9.5 mg/dL (ref 8.4–10.5)
Chloride: 108 mEq/L (ref 96–112)
Creatinine, Ser: 0.75 mg/dL (ref 0.4–1.2)
GFR calc Af Amer: >60 mL/min (ref >60)
Sodium: 141 mEq/L (ref 135–145)

## 2011-02-20 LAB — TROPONIN I: Troponin I: <0.01 ng/mL (ref 0.00–0.06)

## 2011-02-20 LAB — CK TOTAL AND CKMB (NOT AT ARMC): Relative Index: INVALID (ref 0.0–2.5)

## 2011-03-20 NOTE — Discharge Summary (Signed)
[name], [name]                 ACCOUNT NO.:  [id number]   MEDICAL RECORD NO.:  [id number]          PATIENT TYPE:  INP   LOCATION:  3705                         FACILITY:  [facility]   PHYSICIAN:  Sean A. Everardo All, MD    DATE OF BIRTH:  [birth year]   DATE OF ADMISSION:  12/18/2008  DATE OF DISCHARGE:  12/19/2008                               DISCHARGE SUMMARY   REASON FOR ADMISSION:  Chest pain.   HISTORY OF PRESENT ILLNESS:  A 50 year old woman whom I admitted on  December 18, 2008, with chest pain.  Please refer to my dictated history  and physical for details.   HOSPITAL COURSE:  The patient was admitted and during her admission had  which she described as only a very tiny bit of chest pain.  D-dimer and  serial cardiac enzymes were negative.  On December 19, 2008, she was  alert, oriented, ambulatory, and pain-free.  We discussed the treatment  options to include inpatient cardiac nuclear medicine study.  I offered  this to her, and she declined saying she wished to go home.  This will  be done as an outpatient in the next few days.  We discussed the fact  that this is a low risk, but not no risk situation.   DIAGNOSES AT THE TIME OF DISCHARGE:  Same as on admission.   MEDICATIONS:  1. Sublingual nitroglycerin 0.4 mg every 5 minutes as needed for chest      pain.  2. Aspirin 81 mg a day.  3. Otherwise, same as on admission.   FOLLOWUP:  The patient will be contacted for cardiac nuclear study in  the next few days, no restriction on diet, and she is told to increase  her activity slowly.      Sean A. Everardo All, MD  Electronically Signed     SAE/MEDQ  D:  12/19/2008  T:  12/19/2008  Job:  [id number]

## 2011-03-20 NOTE — H&P (Signed)
[name], [name]                 ACCOUNT NO.:  [id number]   MEDICAL RECORD NO.:  [id number]          PATIENT TYPE:  INP   LOCATION:  3705                         FACILITY:  [facility]   PHYSICIAN:  Sean A. Everardo All, MD    DATE OF BIRTH:  05/26/1961   DATE OF ADMISSION:  12/18/2008  DATE OF DISCHARGE:                              HISTORY & PHYSICAL   REASON FOR ADMISSION:  Chest pain.   HISTORY OF PRESENT ILLNESS:  A 50 year old woman who was seen 6 days ago  for an episode of palpitations.  The patient says this was determined to  be due to an allergic reaction to eating tuna.  She now has  approximately 8 hours of intermittent mild chest pain across the  anterior chest wall.  She describes it as a band-like sensation.  It was  improved with nitroglycerin here in the emergency room.  No associated  symptoms.   PAST MEDICAL HISTORY:  1. Anxiety/depression.  2. Ulcerative colitis for which she takes chronic suppressive Flagyl.  3. She is not a smoker.  She drinks alcohol 2-3 drinks, about 3 times      a week.  4. She was recently found to have a high potassium of 6.1 and she came      back for a recheck yesterday that was 5.4 in the office.   FAMILY HISTORY:  Negative for heart disease at a young age.   SOCIAL HISTORY:  She is recently separated after a long marriage.  She  also recently lost her job.   REVIEW OF SYSTEMS:  She says she gained 30 pounds in the past year.  She  denies the following:  Fever, headache, syncope, sore throat, earache,  shortness of breath, abdominal pain, rectal bleeding, hematuria,  dysuria, and skin rash.  She has occasional mild arthralgias.   PHYSICAL EXAMINATION:  Vital Signs:  Blood pressure 123/73, heart rate  is 70, respiratory rate 20, and temperature is 98.3.  GENERAL:  Healthy-appearing woman resting comfortably.  SKIN:  No rash, not diaphoretic.  HEENT:  No proptosis.  No periorbital swelling.  Pharynx is normal.  NECK:  Supple.  CHEST:   Clear to auscultation.  No respiratory distress.  The chest wall  is nontender.  CARDIOVASCULAR:  No JVD.  No edema.  Regular rate and rhythm.  No  murmur.  Pedal pulses are intact and there is no bruit at the carotid  arteries.  ABDOMEN:  Soft, nontender.  No hepatosplenomegaly, no mass.  No hernia.  Breasts, gynecological, and rectal examination not done at this time due  to patient's condition.  EXTREMITIES:  I do not appreciate any osteoarthritic changes.  NEUROLOGIC:  She is alert and well oriented.  She does not appear  anxious nor depressed at the time of my visit.  Cranial nerves appeared  to be intact.  She readily moves all fours.   LABORATORY STUDIES:  To my reading, electrocardiogram is normal x2 in  the emergency room.  Initial cardiac enzymes are negative.  Potassium  4.0.  Chest x-ray, no acute disease.  CBC  and BMET are normal.   IMPRESSION:  1. Chest pain of uncertain etiology, possibly due to      anxiety/depression.  2. Anxiety/depression.  3. Hyperkalemia, uncertain etiology, resolved.  4. Other chronic medical problems as noted above.   PLAN:  1. Admit to any telemetry bed.  2. Finish 3 rounds of cardiac enzymes.  3. Symptomatic therapy.  4. All discussed the case with cardiology in the morning to determine      what studies she would need next, and at what time frame.  5. Given that her potassium is normalized, I do not see that she needs      any follow up now.  6. As far as I am concerned, she can ambulate now which is important      because she is on oral contraceptives.      Sean A. Everardo All, MD  Electronically Signed     SAE/MEDQ  D:  12/18/2008  T:  12/19/2008  Job:  [id number]

## 2011-03-20 NOTE — Op Note (Signed)
[name], [name]                 ACCOUNT NO.:  [id number]   MEDICAL RECORD NO.:  [id number]          PATIENT TYPE:  AMB   LOCATION:  [facility]                         FACILITY:  [facility]   PHYSICIAN:  Timothy P. Fontaine, M.D.DATE OF BIRTH:  1961/06/22   DATE OF PROCEDURE:  04/15/2009  DATE OF DISCHARGE:                               OPERATIVE REPORT   PREOPERATIVE DIAGNOSES:  Endometrial polyp.   POSTOPERATIVE DIAGNOSES:  Endometrial polyp.   PROCEDURE:  Hysteroscopy, polypectomy, dilatation and curettage.   SURGEON:  Dr. Audie Box.   ANESTHETIC:  General with 1% lidocaine paracervical block.   SPECIMEN:  1. Endometrial curetting.  2. Endometrial polyp.   ESTIMATED BLOOD LOSS:  Minimal.   SORBITOL DISCREPANCY:  Minimal.   COMPLICATIONS:  None.   FINDINGS:  EUA:  External BUS, vagina normal.  Cervix normal.  Uterus  normal size, shape and contour, midline and mobile.  Adnexa without  masses.  Hysteroscopic with a single polyp posterior lower uterine  segment excised at its base.  Otherwise endometrial cavity was normal  noting fundus, right and left tubal ostia, anterior and posterior  uterine surfaces, lower uterine segment and endocervical canal all  visualized.   PROCEDURE:  The patient was taken to the operating room, having voided  on the way to the operating room, was placed in the supine position,  underwent general anesthesia, and was placed in low dorsal lithotomy  position.  The patient received a vaginal perineal preparation with  Betadine solution.  EUA performed, and the patient was draped in usual  fashion.  The cervix was visualized with a weighted speculum.  Anterior  lip grasped with single-tooth tenaculum, and the cervix was gently  gradually dilated to admit the operative hysteroscope.  Hysteroscopy was  performed with findings noted above.  Using the right-angle resectoscope  loop, the polyp was excised at its base at the level of the surrounding  endometrium and sent to pathology intact.  A sharp curettage was then  performed, and again the specimen was sent to pathology.  Rehysteroscopy  showed an empty cavity, good distention, no evidence of  perforation.  The instruments were removed.  Hemostasis visualized.  The  patient placed in the supine position, received intraoperative Toradol,  was awakened without difficulty, taken to recovery room in good  condition, having tolerated the procedure well.      Timothy P. Fontaine, M.D.  Electronically Signed     TPF/MEDQ  D:  04/15/2009  T:  04/15/2009  Job:  [id number]

## 2011-03-20 NOTE — H&P (Signed)
NAMEHELMA, [name]                 ACCOUNT NO.:  [id number]   MEDICAL RECORD NO.:  [id number]          PATIENT TYPE:  AMB   LOCATION:  [facility]                         FACILITY:  [facility]   PHYSICIAN:  Timothy P. Fontaine, M.D.DATE OF BIRTH:  [birth year]   DATE OF ADMISSION:  04/15/2009  DATE OF DISCHARGE:                              HISTORY & PHYSICAL   TIME:  At 1:45 p.m., to [facility]   CHIEF COMPLAINT:  Endometrial polyp.   HISTORY OF PRESENT ILLNESS:  A 50 year old G 0, initially underwent  ultrasound for complaints of abdominal bloating.  Ultrasound showed a  thickened endometrial cavity consistent with a polyp with a feeder  vessel measuring 10.7 mm.  She is admitted for hysteroscopy, D and C  removal of polyp.  She is currently on Seasonique birth control doing  well with every 3 month withdrawals bleed.   PAST MEDICAL HISTORY:  Uncomplicated.  Initially had been diagnosed with  MVP, but on subsequent evaluation by cardiology was felt not to have  MVP.   PAST SURGICAL HISTORY:  1. Includes colectomy with ileostomy subsequent adhesiolysis.  2. Breast implants.   CURRENT MEDICATIONS:  1. Metronidazole 250 daily.  2. Cymbalta 60 mg daily.  3. Wellbutrin 150 mg daily.  4. Seasonique oral contraceptives.   ALLERGIES:  CODEINE.   REVIEW OF SYSTEMS:  Noncontributory.   FAMILY HISTORY:  Noncontributory.   SOCIAL HISTORY:  Noncontributory.   ADMISSION PHYSICAL EXAMINATION:  VITAL SIGNS:  Stable.  Afebrile.  HEENT:  Normal.  LUNGS:  Clear.  CARDIAC:  Regular rate.  No rubs, murmurs or gallops.  ABDOMEN:  Benign.  PELVIC:  External BUS, vagina normal.  Cervix grossly normal.  Uterus  normal size, midline and mobile, nontender.  Adnexa without masses or  tenderness.   ASSESSMENT:  A 50 year old G 0.  Ultrasound for abdominal bloating which  was normal other than a thickened endometrium with a classic appearing  polyp with feeder vessel for hysteroscopy, D  and C.  I reviewed the  proposed surgery with the patient, the expected intraoperative and  postoperative courses, instrumentation to include the use of the  hysteroscope, resectoscope, D and C.  The risk of infections, hemorrhage  necessitating transfusion, the risks of transfusion, uterine  perforation, damage to internal organs including bowel, bladder,  ureters, vessels and nerves necessitating major exploratory reparative  surgeries, future reparative surgeries, bowel resection, ostomy  formation, bladder repair, ureteral damage repair was all discussed,  understood and accepted.  The patient's questions were answered to her  satisfaction.  She is ready to proceed with surgery.  She was given a  Cytotec 200 mcg tablet the evening before surgery to facilitate  dilatation.      Timothy P. Fontaine, M.D.  Electronically Signed     TPF/MEDQ  D:  04/14/2009  T:  04/14/2009  Job:  [id number]

## 2011-04-30 ENCOUNTER — Other Ambulatory Visit: Payer: Self-pay | Admitting: Family Medicine

## 2011-05-01 NOTE — Telephone Encounter (Signed)
Last seen 01/11/11 and filled 10/13/10 with 5 refills    Please advise     KP

## 2011-05-30 ENCOUNTER — Other Ambulatory Visit: Payer: Self-pay | Admitting: Family Medicine

## 2011-07-22 ENCOUNTER — Other Ambulatory Visit: Payer: Self-pay | Admitting: Family Medicine

## 2011-10-29 ENCOUNTER — Encounter: Payer: Self-pay | Admitting: Family Medicine

## 2011-11-27 ENCOUNTER — Encounter: Payer: Self-pay | Admitting: Family Medicine

## 2011-11-27 ENCOUNTER — Ambulatory Visit (INDEPENDENT_AMBULATORY_CARE_PROVIDER_SITE_OTHER): Payer: No Typology Code available for payment source | Admitting: Family Medicine

## 2011-11-27 VITALS — BP 118/72 | HR 68 | Temp 97.9°F | Wt 164.4 lb

## 2011-11-27 DIAGNOSIS — B351 Tinea unguium: Secondary | ICD-10-CM

## 2011-11-27 MED ORDER — CICLOPIROX 8 % EX SOLN: Freq: Every day | CUTANEOUS | Status: AC

## 2011-11-27 NOTE — Progress Notes (Signed)
  Subjective:    Patient ID: [name], female    DOB: Jun 12, 1961, 51 y.o.   MRN: [id number]  HPI Pt here c/o fungus on finger nail.  She is thinking about lamisil vs penlac again.  No other complaints.   Review of Systems As above    Objective:   Physical Exam  Constitutional: She is oriented to person, place, and time. She appears well-developed and well-nourished.  Musculoskeletal:       R middle finger---nail, yellow, thick and flaky  Neurological: She is alert and oriented to person, place, and time.  Psychiatric: She has a normal mood and affect. Her behavior is normal. Judgment and thought content normal.          Assessment & Plan:  Fingernail fungus---penlac,  rto prn

## 2011-12-04 ENCOUNTER — Ambulatory Visit (INDEPENDENT_AMBULATORY_CARE_PROVIDER_SITE_OTHER): Payer: No Typology Code available for payment source | Admitting: Family Medicine

## 2011-12-04 ENCOUNTER — Encounter: Payer: Self-pay | Admitting: Family Medicine

## 2011-12-04 VITALS — BP 112/76 | HR 72 | Temp 98.0°F | Ht 68.0 in | Wt 162.0 lb

## 2011-12-04 DIAGNOSIS — Z0000 Encounter for general adult medical examination without abnormal findings: Secondary | ICD-10-CM

## 2011-12-04 LAB — CBC WITH DIFFERENTIAL/PLATELET
Basophils Relative: 0.3 % (ref 0.0–3.0)
Eosinophils Absolute: 0.1 10*3/uL (ref 0.0–0.7)
Eosinophils Relative: 1.6 % (ref 0.0–5.0)
Hemoglobin: 14 g/dL (ref 12.0–15.0)
MCHC: 33.7 g/dL (ref 30.0–36.0)
MCV: 94.3 fl (ref 78.0–100.0)
Monocytes Absolute: 0.4 10*3/uL (ref 0.1–1.0)
Neutro Abs: 6 10*3/uL (ref 1.4–7.7)
Neutrophils Relative %: 71.5 % (ref 43.0–77.0)
RBC: 4.4 Mil/uL (ref 3.87–5.11)
WBC: 8.3 10*3/uL (ref 4.5–10.5)

## 2011-12-04 LAB — BASIC METABOLIC PANEL
CO2: 26 mEq/L (ref 19–32)
Chloride: 107 mEq/L (ref 96–112)
Creatinine, Ser: 0.7 mg/dL (ref 0.4–1.2)
Potassium: 4.2 mEq/L (ref 3.5–5.1)
Sodium: 141 mEq/L (ref 135–145)

## 2011-12-04 LAB — POCT URINALYSIS DIPSTICK
Blood, UA: NEGATIVE
Nitrite, UA: NEGATIVE
Protein, UA: NEGATIVE
Spec Grav, UA: 1.015
Urobilinogen, UA: 0.2

## 2011-12-04 LAB — HEPATIC FUNCTION PANEL
ALT: 21 U/L (ref 0–35)
Albumin: 4.2 g/dL (ref 3.5–5.2)
Alkaline Phosphatase: 48 U/L (ref 39–117)
Bilirubin, Direct: 0 mg/dL (ref 0.0–0.3)
Total Protein: 7.3 g/dL (ref 6.0–8.3)

## 2011-12-04 LAB — LIPID PANEL
LDL Cholesterol: 95 mg/dL (ref 0–99)
Total CHOL/HDL Ratio: 3
VLDL: 15 mg/dL (ref 0.0–40.0)

## 2011-12-04 NOTE — Patient Instructions (Signed)

## 2011-12-04 NOTE — Progress Notes (Signed)
  Subjective:     [name] is a 50 y.o. female and is here for a comprehensive physical exam. The patient reports problems - pt struggling with weight gain.  History   Social History  . Marital Status: Married    Spouse Name: N/A    Number of Children: N/A  . Years of Education: N/A   Occupational History  . not working    Social History Main Topics  . Smoking status: Never Smoker   . Smokeless tobacco: Never Used  . Alcohol Use: No  . Drug Use: No  . Sexually Active: Not Currently -- Female partner(s)   Other Topics Concern  . Not on file   Social History Narrative  . No narrative on file   Health Maintenance  Topic Date Due  . Mammogram  11/11/2011  . Influenza Vaccine  08/05/2012  . Tetanus/tdap  12/16/2012  . Colonoscopy  12/21/2013  . Pap Smear  04/19/2014    The following portions of the patient's history were reviewed and updated as appropriate: allergies, current medications, past family history, past medical history, past social history, past surgical history and problem list.  Review of Systems Review of Systems  Constitutional: Negative for activity change, appetite change and fatigue.  HENT: Negative for hearing loss, congestion, tinnitus and ear discharge.  dentist q64m Eyes: Negative for visual disturbance (see optho q2y -- vision corrected to 20/20 with glasses).  Respiratory: Negative for cough, chest tightness and shortness of breath.   Cardiovascular: Negative for chest pain, palpitations and leg swelling.  Gastrointestinal: Negative for abdominal pain, diarrhea, constipation and abdominal distention.  Genitourinary: Negative for urgency, frequency, decreased urine volume and difficulty urinating.  Musculoskeletal: Negative for back pain, arthralgias and gait problem.  Skin: Negative for color change, pallor and rash.  Neurological: Negative for dizziness, light-headedness, numbness and headaches.  Hematological: Negative for adenopathy. Does not  bruise/bleed easily.  Psychiatric/Behavioral: Negative for suicidal ideas, confusion, sleep disturbance, self-injury, dysphoric mood, decreased concentration and agitation.       Objective:    BP 112/76  Pulse 72  Temp(Src) 98 F (36.7 C) (Oral)  Ht 5\' 8"  (1.727 m)  Wt 162 lb (73.483 kg)  BMI 24.63 kg/m2  SpO2 99% General appearance: alert, cooperative, appears stated age and no distress Head: Normocephalic, without obvious abnormality, atraumatic Eyes: conjunctivae/corneas clear. PERRL, EOM's intact. Fundi benign. Ears: normal TM's and external ear canals both ears Nose: Nares normal. Septum midline. Mucosa normal. No drainage or sinus tenderness. Throat: lips, mucosa, and tongue normal; teeth and gums normal Neck: no adenopathy, no carotid bruit, no JVD, supple, symmetrical, trachea midline and thyroid not enlarged, symmetric, no tenderness/mass/nodules Back: symmetric, no curvature. ROM normal. No CVA tenderness. Lungs: clear to auscultation bilaterally Breasts: gyn Heart: S1, S2 normal Abdomen: colostomy,  s/p total colectomy Pelvic: gyn Extremities: extremities normal, atraumatic, no cyanosis or edema Pulses: 2+ and symmetric Skin: Skin color, texture, turgor normal. No rashes or lesions Lymph nodes: Cervical, supraclavicular, and axillary nodes normal. Neurologic: Alert and oriented X 3, normal strength and tone. Normal symmetric reflexes. Normal coordination and gait psych---normal    Assessment:    Healthy female exam.     hx UC---  + colostomy Plan:     See After Visit Summary for Counseling Recommendations

## 2011-12-10 ENCOUNTER — Telehealth: Payer: Self-pay | Admitting: *Deleted

## 2011-12-10 NOTE — Telephone Encounter (Signed)
LFT normal----ok to start sporonox 200mg  po qd x 12 weeks Check bp at 4-6 weeks and 12 weeks---790.4

## 2011-12-10 NOTE — Telephone Encounter (Signed)
[facility] Triage Call Report Triage Record Num: [id number] Operator: Frederico Hamman Patient Name: [name] Call Date & Time: 12/10/2011 1:04:35PM Patient Phone: [phone] PCP: Lelon Perla Patient Gender: Female PCP Fax : [phone] Patient DOB: Jul 14, 1961 Practice Name: [facility] Reason for Call: [name] AT [phone] Caller: [name]/Patient; PCP: Lelon Perla.; CB#: [phone]; ; ; Call regarding She Has Gotten Into [place] and Needs Something Called in Also Needs Assistance in My Chart,.; Brissa states she was exposed to [facility] on 12/10/11. Has itchy red, raised rash 1/4 inch from eye, neck, chest, genitals and thigh onset 1500 on 12/08/11. Has see provider within 4 hr disposition. Pt states she is in [place] and cannot come into office on 12/10/11. Requesting med called in to [facility] [phone]. Also, states she was seen in office on 11/27/11 for nail fungus. States she had blood work drawn to check liver functions. Has not heard back from office regarding lab work and med for nail fungus -States she has prescription for [facility]. Was told if liver functions normal that a prescription for Sporanox would be called in to [facility]. Will being leaving for [facility] on 01/08 Office note Protocol(s) Used:

## 2011-12-11 ENCOUNTER — Encounter: Payer: Self-pay | Admitting: Family Medicine

## 2011-12-11 ENCOUNTER — Ambulatory Visit (INDEPENDENT_AMBULATORY_CARE_PROVIDER_SITE_OTHER): Payer: No Typology Code available for payment source | Admitting: Family Medicine

## 2011-12-11 ENCOUNTER — Telehealth: Payer: Self-pay | Admitting: Family Medicine

## 2011-12-11 VITALS — BP 112/72 | HR 66 | Temp 98.6°F | Wt 160.0 lb

## 2011-12-11 DIAGNOSIS — L255 Unspecified contact dermatitis due to plants, except food: Secondary | ICD-10-CM

## 2011-12-11 DIAGNOSIS — F411 Generalized anxiety disorder: Secondary | ICD-10-CM

## 2011-12-11 DIAGNOSIS — L089 Local infection of the skin and subcutaneous tissue, unspecified: Secondary | ICD-10-CM

## 2011-12-11 DIAGNOSIS — L247 Irritant contact dermatitis due to plants, except food: Secondary | ICD-10-CM

## 2011-12-11 DIAGNOSIS — F419 Anxiety disorder, unspecified: Secondary | ICD-10-CM

## 2011-12-11 MED ORDER — ITRACONAZOLE 100 MG PO CAPS: 200.0000 mg | ORAL_CAPSULE | Freq: Every day | ORAL | Status: DC

## 2011-12-11 MED ORDER — METHYLPREDNISOLONE ACETATE PF 80 MG/ML IJ SUSP
80.0000 mg | Freq: Once | INTRAMUSCULAR | Status: AC
  Administered 2011-12-11: 80 mg via INTRAMUSCULAR

## 2011-12-11 MED ORDER — CEPHALEXIN 500 MG PO CAPS: 500.0000 mg | ORAL_CAPSULE | Freq: Four times a day (QID) | ORAL | Status: AC

## 2011-12-11 MED ORDER — CLONAZEPAM 1 MG PO TABS: ORAL_TABLET | ORAL | Status: DC

## 2011-12-11 MED ORDER — TERBINAFINE HCL 250 MG PO TABS: 250.0000 mg | ORAL_TABLET | Freq: Every day | ORAL | Status: AC

## 2011-12-11 MED ORDER — PREDNISONE 10 MG PO TABS: ORAL_TABLET | ORAL | Status: DC

## 2011-12-11 NOTE — Telephone Encounter (Signed)
[facility] Triage Call Report Triage Record Num: [id number] Operator: Thayer Headings Patient Name: [name] Call Date & Time: 12/11/2011 9:01:22AM Patient Phone: [phone] PCP: Lelon Perla Patient Gender: Female PCP Fax : [phone] Patient DOB: November 25, 1960 Practice Name: [facility] Reason for Call: Caller: [name]/Patient; PCP: Lelon Perla.; CB#: [phone]; ; ; Calling today 12/11/11 regarding Poison Ivy; onset 12/08/11. Has poison ivy on face near eye, neck, chest, vaginal area, thighs, hand. Emergent symptoms r/o by [employer], [place], or [place] Exposure guidelines with exception of involves, eyes, mouth or genitals. Care advice given. Appt scheduled for today at 1:45 PM with Dr. Laury Axon. Protocol(s) Used: [employer], [place], or [facility] Exposure Recommended Outcome per Protocol: See Provider within 4 hours Reason for Outcome: Involves eyes, mouth, or genitals. Care Advice: ~ Resting will help avoid overheating and sweating which will intensify the irritation. Apply cool compresses to affected area(s) for 20 minutes 4 to 6 times daily to help relieve itching and provide a topical anesthesia. ~ ~ Call provider if symptoms worsen or new symptoms develop. Calamine is appropriate if used as directed on the label or by a pharmacist. DO NOT use topical preparations with benzocaine because they can be sensitizing agents and can cause an allergic reaction. ~ ~ SYMPTOM / CONDITION MANAGEMENT ~ CAUTIONS ~ List, or take, all current prescription(s), nonprescription or alternative medication(s) to provider for evaluation. For symptom relief, consider nonprescription antihistamines (such as Allerest, Claritin, Zyrtec, Chlor-Trimetron, Benadryl, etc.) as directed on label or by pharmacist. Drowsiness may result, especially in geriatric patients. Non-sedating antihistamines are available without a prescription. ~ Itching Relief: - Avoid scratching or rubbing irritated area; may  cause further irritation and secondary infection - Take cool showers or baths several times a day to relieve itching; do not use soap - If cool water alone does not relieve itching, try adding 1/2 to 1 cup baking soda to bath water - Follow with application of a bland lotion such as calamine (do not apply to the eyes or genitals). ~ 02

## 2011-12-11 NOTE — Telephone Encounter (Signed)
Patient is scheduled to come in today for an Eval   KP

## 2011-12-11 NOTE — Progress Notes (Signed)
  Subjective:     [name] is a 51 y.o. female who presents for evaluation of a rash involving the face, hand and buttocks and neck. Rash started several days ago. Lesions are pink, and blistering in texture. Rash has changed over time. Rash is pruritic. Associated symptoms: none. Patient denies: abdominal pain, arthralgia, congestion, cough, crankiness, decrease in appetite, decrease in energy level, fever, headache, irritability, myalgia, nausea, sore throat and vomiting. Patient has not had contacts with similar rash. Patient has had new exposures (soaps, lotions, laundry detergents, foods, medications, plants, insects or animals).Pt was exposed to poison oak. Pt states her finger is also worse--it is draining pus now.  See last visit.  The following portions of the patient's history were reviewed and updated as appropriate: allergies, current medications, past family history, past medical history, past social history, past surgical history and problem list.  Review of Systems Pertinent items are noted in HPI.    Objective:    BP 112/72  Pulse 66  Temp(Src) 98.6 F (37 C) (Oral)  Wt 160 lb (72.576 kg)  SpO2 98% General:  alert, cooperative, appears stated age and mild distress  Skin:  vesicles noted on face and buttocks and neck   middle finger --r hand---nail thick, yellow and yellow drainage from under nail  Assessment:    contact dermatitis: plants poison oak   onchomycosis finger nail with infection=--- lamisil , keflex Plan:    Medications: steroids: pred taper. Written patient instruction given. f/u prn

## 2011-12-11 NOTE — Telephone Encounter (Signed)
Discussed with patient and she also wanted to know if she could get something for the poison Ivy, she stated it was on her face, torso and thigh. She has had the poison since Saturday, she picked it up when she was having trees cut down in her yard.  Please advise      KP

## 2011-12-11 NOTE — Telephone Encounter (Signed)
Apt scheduled for 1:45 today     KP

## 2011-12-11 NOTE — Telephone Encounter (Signed)
Correction ---check hep not bp   For a rx for poison ivy I would need to see her----  If it is bad enough she may need a shot.

## 2011-12-11 NOTE — Patient Instructions (Signed)
Contact Dermatitis Contact dermatitis is a reaction to certain substances that touch the skin. Contact dermatitis can be either irritant contact dermatitis or allergic contact dermatitis. Irritant contact dermatitis does not require previous exposure to the substance for a reaction to occur. Allergic contact dermatitis only occurs if you have been exposed to the substance before. Upon a repeat exposure, your body reacts to the substance.   CAUSES   Many substances can cause contact dermatitis. Irritant dermatitis is most commonly caused by repeated exposure to mildly irritating substances, such as:  Makeup.     Soaps.    Detergents.    Bleaches.    Acids.    Metal salts, such as nickel.  Allergic contact dermatitis is most commonly caused by exposure to:  Poisonous plants.     Chemicals (deodorants, shampoos).     Jewelry.    Latex.    Neomycin in triple antibiotic cream.     Preservatives in products, including clothing.  SYMPTOMS   The area of skin that is exposed may develop:  Dryness or flaking.     Redness.    Cracks.    Itching.    Pain or a burning sensation.     Blisters.  With allergic contact dermatitis, there may also be swelling in areas such as the eyelids, mouth, or genitals.   DIAGNOSIS   Your caregiver can usually tell what the problem is by doing a physical exam. In cases where the cause is uncertain and an allergic contact dermatitis is suspected, a patch skin test may be performed to help determine the cause of your dermatitis. TREATMENT Treatment includes protecting the skin from further contact with the irritating substance by avoiding that substance if possible. Barrier creams, powders, and gloves may be helpful. Your caregiver may also recommend:  Steroid creams or ointments applied 2 times daily. For best results, soak the rash area in cool water for 20 minutes. Then apply the medicine. Cover the area with a plastic wrap. You can store the  steroid cream in the refrigerator for a "chilly" effect on your rash. That may decrease itching. Oral steroid medicines may be needed in more severe cases.     Antibiotics or antibacterial ointments if a skin infection is present.     Antihistamine lotion or an antihistamine taken by mouth to ease itching.     Lubricants to keep moisture in your skin.     Burow's solution to reduce redness and soreness or to dry a weeping rash. Mix one packet or tablet of solution in 2 cups cool water. Dip a clean washcloth in the mixture, wring it out a bit, and put it on the affected area. Leave the cloth in place for 30 minutes. Do this as often as possible throughout the day.     Taking several cornstarch or baking soda baths daily if the area is too large to cover with a washcloth.  Harsh chemicals, such as alkalis or acids, can cause skin damage that is like a burn. You should flush your skin for 15 to 20 minutes with cold water after such an exposure. You should also seek immediate medical care after exposure. Bandages (dressings), antibiotics, and pain medicine may be needed for severely irritated skin.   HOME CARE INSTRUCTIONS  Avoid the substance that caused your reaction.     Keep the area of skin that is affected away from hot water, soap, sunlight, chemicals, acidic substances, or anything else that would irritate your skin.       Do not scratch the rash. Scratching may cause the rash to become infected.     You may take cool baths to help stop the itching.     Only take over-the-counter or prescription medicines as directed by your caregiver.     See your caregiver for follow-up care as directed to make sure your skin is healing properly.  SEEK MEDICAL CARE IF:    Your condition is not better after 3 days of treatment.     You seem to be getting worse.     You see signs of infection such as swelling, tenderness, redness, soreness, or warmth in the affected area.     You have any problems  related to your medicines.  Document Released: 10/19/2000 Document Revised: 07/04/2011 Document Reviewed: 03/27/2011 ExitCare Patient Information 2012 [employer]. 

## 2011-12-28 ENCOUNTER — Telehealth: Payer: Self-pay

## 2011-12-28 NOTE — Telephone Encounter (Signed)
Call from CAN and she reported patient is having severe abdominal pain, the patient has reported having this issue in the past and having a portion of her colon removed.  She thinks it could be from the Lamisil.  Discussed with Dr.[facility] and she advised lamisil can cause NVD and abdominal pain ut if patient is that severe with her Hx she will need to be evaluated. CAN will mae the patient aware.    KP

## 2011-12-31 NOTE — Telephone Encounter (Signed)
[facility] Triage Call Report Triage Record Num: [id number] Operator: Audelia Hives Patient Name: [name] Call Date & Time: 12/28/2011 4:46:08PM Patient Phone: [phone] PCP: Lelon Perla Patient Gender: Female PCP Fax : [phone] Patient DOB: 07/14/1961 Practice Name: [facility] Reason for Call: Caller: Avriel/Patient is calling with a question about Terbinasine (generic Lamisil).The medication was written by Lelon Perla.. Pt calling regarding abd/gas pains. Started at 0600 this am. Started Lamisil 2 weeks ago for fungal infection to finger. Rates pain a 2-3 at this time. Took Zantac at 0900. Saw SE of Lamisil and can cause gas/abd pain. States last time she had pain like this she had surgery and colon removed. Emergent s/s for Abdominal Pain r/o per protocol except for see in ED due to abd pain that has been contimuous for 3 hours and unable to carry out activities. Per Dr. Hulan Saas nurse proceed to ED/UC and be evaluated, pt compliant. Protocol(s) Used: Abdominal Pain Recommended Outcome per Protocol: See ED Immediately Reason for Outcome: Abdominal pain that has steadily worsened over hours OR has been continuous for 3 hours or more AND any of the following: loss of appetite, vomiting starting after pain, any fever, OR unable to carry out normal activities Care Advice: ~ Pain medication or laxatives should not be taken until symptoms are evaluated. ~ Do not eat or drink anything until evaluated by provider.

## 2012-06-27 DIAGNOSIS — Z8041 Family history of malignant neoplasm of ovary: Secondary | ICD-10-CM | POA: Insufficient documentation

## 2012-12-31 ENCOUNTER — Encounter: Payer: Self-pay | Admitting: Family Medicine

## 2013-01-12 ENCOUNTER — Encounter: Payer: No Typology Code available for payment source | Admitting: Family Medicine

## 2016-07-16 DIAGNOSIS — K91850 Pouchitis: Secondary | ICD-10-CM | POA: Insufficient documentation

## 2017-01-17 DIAGNOSIS — H2513 Age-related nuclear cataract, bilateral: Secondary | ICD-10-CM | POA: Insufficient documentation

## 2017-01-17 DIAGNOSIS — H04123 Dry eye syndrome of bilateral lacrimal glands: Secondary | ICD-10-CM | POA: Insufficient documentation

## 2017-07-05 DIAGNOSIS — R0609 Other forms of dyspnea: Secondary | ICD-10-CM | POA: Insufficient documentation

## 2017-08-21 DIAGNOSIS — H349 Unspecified retinal vascular occlusion: Secondary | ICD-10-CM | POA: Insufficient documentation

## 2017-11-25 DIAGNOSIS — N200 Calculus of kidney: Secondary | ICD-10-CM | POA: Insufficient documentation

## 2017-12-12 ENCOUNTER — Telehealth: Payer: Self-pay | Admitting: *Deleted

## 2017-12-12 NOTE — Telephone Encounter (Signed)
Received request for Medical Records from [facility] forwarded to [place] for email/scan/SLS 02/07

## 2020-05-20 ENCOUNTER — Telehealth: Payer: Self-pay | Admitting: Family Medicine

## 2020-05-20 NOTE — Telephone Encounter (Signed)
No, [facility] is not accepting new patients at this time.

## 2020-05-20 NOTE — Telephone Encounter (Signed)
Patient requesting to re-establish care with Dr Etter Sjogren .  Please Advise

## 2020-05-25 NOTE — Telephone Encounter (Signed)
Patient was informed of the decision.

## 2020-10-27 ENCOUNTER — Other Ambulatory Visit: Payer: Self-pay | Admitting: Oncology

## 2020-10-27 ENCOUNTER — Telehealth: Payer: Self-pay | Admitting: Oncology

## 2020-10-27 ENCOUNTER — Ambulatory Visit (HOSPITAL_COMMUNITY)
Admission: RE | Admit: 2020-10-27 | Discharge: 2020-10-27 | Disposition: A | Discharge status: Home / Self Care | Payer: No Typology Code available for payment source | Source: Ambulatory Visit | Attending: Pulmonary Disease | Admitting: Pulmonary Disease

## 2020-10-27 DIAGNOSIS — U071 COVID-19: Secondary | ICD-10-CM

## 2020-10-27 MED ORDER — SODIUM CHLORIDE 0.9 % IV SOLN: Freq: Once | INTRAVENOUS | Status: AC

## 2020-10-27 MED ORDER — ONDANSETRON HCL 4 MG/2ML IJ SOLN
4.0000 mg | Freq: Once | INTRAMUSCULAR | Status: AC
  Administered 2020-10-27: 17:00:00 4 mg via INTRAVENOUS
  Filled 2020-10-27: qty 2

## 2020-10-27 MED ORDER — SODIUM CHLORIDE 0.9 % IV SOLN: INTRAVENOUS | Status: DC | PRN

## 2020-10-27 MED ORDER — METHYLPREDNISOLONE SODIUM SUCC 125 MG IJ SOLR: 125.0000 mg | Freq: Once | INTRAMUSCULAR | Status: DC | PRN

## 2020-10-27 MED ORDER — EPINEPHRINE 0.3 MG/0.3ML IJ SOAJ: 0.3000 mg | Freq: Once | INTRAMUSCULAR | Status: DC | PRN

## 2020-10-27 MED ORDER — DIPHENHYDRAMINE HCL 50 MG/ML IJ SOLN: 50.0000 mg | Freq: Once | INTRAMUSCULAR | Status: DC | PRN

## 2020-10-27 MED ORDER — FAMOTIDINE IN NACL 20-0.9 MG/50ML-% IV SOLN: 20.0000 mg | Freq: Once | INTRAVENOUS | Status: DC | PRN

## 2020-10-27 MED ORDER — ALBUTEROL SULFATE HFA 108 (90 BASE) MCG/ACT IN AERS: 2.0000 | INHALATION_SPRAY | Freq: Once | RESPIRATORY_TRACT | Status: DC | PRN

## 2020-10-27 NOTE — Discharge Instructions (Signed)
If you have any questions or concerns after the infusion please call the Advanced Practice Provider on call at [phone]. This number is ONLY intended for your use regarding questions or concerns about the infusion post-treatment side-effects.  Please do not provide this number to others for use. For return to work notes please contact your primary care provider.  ° °If someone you know is interested in receiving treatment please have them call the COVID hotline at [phone]. ° ° ° ° ° ° °What types of side effects do monoclonal antibody drugs cause?  °Common side effects ° °In general, the more common side effects caused by monoclonal antibody drugs include: °• Allergic reactions, such as hives or itching °• Flu-like signs and symptoms, including chills, fatigue, fever, and muscle aches and pains °• Nausea, vomiting °• Diarrhea °• Skin rashes °• Low blood pressure ° ° °The CDC is recommending patients who receive monoclonal antibody treatments wait at least 90 days before being vaccinated. ° °Currently, there are no data on the safety and efficacy of mRNA COVID-19 vaccines in persons who received monoclonal antibodies or convalescent plasma as part of COVID-19 treatment. Based on the estimated half-life of such therapies as well as evidence suggesting that reinfection is uncommon in the 90 days after initial infection, vaccination should be deferred for at least 90 days, as a precautionary measure until additional information becomes available, to avoid interference of the antibody treatment with vaccine-induced immune responses. ° ° ° ° ° ° °10 Things You Can Do to Manage Your COVID-19 Symptoms at Home °If you have possible or confirmed COVID-19: °1. Stay home from work and school. And stay away from other public places. If you must go out, avoid using any kind of public transportation, ridesharing, or taxis. °2. Monitor your symptoms carefully. If your symptoms get worse, call your healthcare provider  immediately. °3. Get rest and stay hydrated. °4. If you have a medical appointment, call the healthcare provider ahead of time and tell them that you have or may have COVID-19. °5. For medical emergencies, call 911 and notify the dispatch personnel that you have or may have COVID-19. °6. Cover your cough and sneezes with a tissue or use the inside of your elbow. °7. Wash your hands often with soap and water for at least 20 seconds or clean your hands with an alcohol-based hand sanitizer that contains at least 60% alcohol. °8. As much as possible, stay in a specific room and away from other people in your home. Also, you should use a separate bathroom, if available. If you need to be around other people in or outside of the home, wear a mask. °9. Avoid sharing personal items with other people in your household, like dishes, towels, and bedding. °10. Clean all surfaces that are touched often, like counters, tabletops, and doorknobs. Use household cleaning sprays or wipes according to the label instructions. °cdc.gov/coronavirus °05/06/2019 °This information is not intended to replace advice given to you by your health care provider. Make sure you discuss any questions you have with your health care provider. °Document Revised: 10/08/2019 Document Reviewed: 10/08/2019 °Elsevier Patient Education © 2020 Elsevier Inc. ° °

## 2020-10-27 NOTE — Telephone Encounter (Signed)
Re: Mab Infusion  Called to Discuss with patient about Covid symptoms and the use monoclonal antibody infusion for those with mild to moderate Covid symptoms and at a high risk of hospitalization.     Pt is qualified for this infusion at the [facility] due to co-morbid conditions and/or a member of an at-risk group.    Onset sx- 10/23/20 Vaccination status- No   Past Medical History:  Diagnosis Date  . 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

## 2020-10-27 NOTE — Progress Notes (Signed)
  Diagnosis: COVID-19  Physician: Dr. Joya Gaskins  Procedure: Covid Infusion Clinic Med: casirivimab\imdevimab infusion - Provided patient with casirivimab\imdevimab fact sheet for patients, parents and caregivers prior to infusion.  Complications: No immediate complications noted.  Post mAb infusion from 5:00 to 5:13 PM pt said she felt shaky. Her arms and legs were visibly shaking. Said she does not feel cold. Vitals BP 158/86, MAP 108, HR 60, O2 100%, Temp 98.3. At 5:15 PM said she felt okay and wanted to continue receiving normal saline for hydration. No further symptoms reports.    Discharge: Discharged home   Tia Masker 10/27/2020

## 2021-04-10 ENCOUNTER — Encounter: Payer: Self-pay | Admitting: Family Medicine

## 2021-04-10 ENCOUNTER — Ambulatory Visit (HOSPITAL_BASED_OUTPATIENT_CLINIC_OR_DEPARTMENT_OTHER)
Admission: RE | Admit: 2021-04-10 | Discharge: 2021-04-10 | Disposition: A | Discharge status: Home / Self Care | Payer: Medicare Other | Source: Ambulatory Visit | Attending: Family Medicine | Admitting: Family Medicine

## 2021-04-10 ENCOUNTER — Ambulatory Visit (INDEPENDENT_AMBULATORY_CARE_PROVIDER_SITE_OTHER): Payer: Medicare Other | Admitting: Family Medicine

## 2021-04-10 ENCOUNTER — Other Ambulatory Visit: Payer: Self-pay

## 2021-04-10 VITALS — BP 98/68 | HR 61 | Temp 98.4°F | Resp 18 | Ht 68.0 in | Wt 156.8 lb

## 2021-04-10 DIAGNOSIS — M545 Low back pain, unspecified: Secondary | ICD-10-CM | POA: Insufficient documentation

## 2021-04-10 MED ORDER — METHOCARBAMOL 500 MG PO TABS
500.0000 mg | ORAL_TABLET | Freq: Four times a day (QID) | ORAL | 1 refills | Status: DC
Start: 2021-04-10 — End: 2021-08-15

## 2021-04-10 NOTE — Progress Notes (Addendum)
Patient ID: [name], female    DOB: 06/24/1961  Age: 60 y.o. MRN: [id number]    Subjective:  Subjective  HPI [name] presents for an office visit today. She complains of L lower back pain x2 month. She notes that she has been performing strenuous activity (pushing mattress, moving bale, and putting grills together). She states that the back pain has improved greatly, and is now "vague".  She reports that it worsen when she performs strenuous activity. She notes that she had mild degenerative disk in her back. 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 She also complains of hot flashes x9 years. She notes that she has hot flashes every 30 minutes. She endorses taking 1 mg of clonazePAM PO Daily, however it didn't relieved her symptoms. She denies any chest pain, SOB, fever, abdominal pain, cough, chills, sore throat, dysuria, urinary incontinence, HA, or N/V/D at this time. She notes that in 2018, she had pins placed in her Left UE. She states that she has decrease ROM, but she had adapted. She has a FMHx of arthritis (brother). She reports that her mother has variant for ovarian cancer. She notes that she had a colectomy. She endorses being dx Afib and is currently taking 5 mg of Apixaban PO Daily.  Review of Systems  Constitutional: Negative for chills, fatigue and fever.  HENT: Negative for ear pain, rhinorrhea, sinus pressure, sinus pain, sore throat and tinnitus.   Eyes: Negative for pain.  Respiratory: Negative for cough, shortness of breath and wheezing.   Cardiovascular: Negative for chest pain.  Gastrointestinal: Negative for abdominal pain, anal bleeding, constipation, diarrhea, nausea and vomiting.  Endocrine:       (+) hot flashes   Genitourinary: Negative for flank pain.  Musculoskeletal: Positive for back pain (L lower back). Negative for neck pain.  Skin: Negative for rash.  Neurological: Negative for seizures, weakness, light-headedness, numbness and headaches.     History Past Medical History:  Diagnosis Date  . A-fib ([place])   . Depression     She has a past surgical history that includes Brain surgery (06/2005); Colon surgery (1998); iliostemy (03/2007); Shoulder surgery (Left); and Wrist surgery (Left).   Her family history includes Anxiety disorder in an other family member; Arthritis in her brother; COPD in her mother; Cancer (age of onset: 71) in her father; Cancer (age of onset: 35) in her mother; Depression in her mother; Diabetes in her mother; Emphysema in her mother; Hypertension in her mother; Multiple myeloma in her father; Obesity in her mother; Ovarian cancer in her mother.She reports that she has never smoked. She has never used smokeless tobacco. She reports that she does not drink alcohol and does not use drugs.  Current Outpatient Medications on File Prior to Visit  Medication Sig Dispense Refill  . apixaban (ELIQUIS) 5 MG TABS tablet Take by mouth.    . clonazePAM (KLONOPIN) 1 MG tablet 1 po qhs prn 30 tablet 1  . escitalopram (LEXAPRO) 10 MG tablet Take 1 tablet by mouth daily.     No current facility-administered medications on file prior to visit.     Objective:  Objective  Physical Exam Vitals and nursing note reviewed.  Constitutional:      General: She is not in acute distress.    Appearance: Normal appearance. She is well-developed. She is not ill-appearing.  HENT:     Head: Normocephalic and atraumatic.     Right Ear: External ear normal.  Left Ear: External ear normal.     Nose: Nose normal.  Eyes:     General:        Right eye: No discharge.        Left eye: No discharge.     Extraocular Movements: Extraocular movements intact.     Pupils: Pupils are equal, round, and reactive to light.  Cardiovascular:     Rate and Rhythm: Normal rate. Rhythm irregular.     Pulses: Normal pulses.     Heart sounds: Normal heart sounds. No murmur heard. No friction rub. No gallop.   Pulmonary:     Effort: Pulmonary  effort is normal. No respiratory distress.     Breath sounds: Normal breath sounds. No stridor. No wheezing, rhonchi or rales.  Chest:     Chest wall: No tenderness.  Abdominal:     General: Bowel sounds are normal. There is no distension.     Palpations: Abdomen is soft. There is no mass.     Tenderness: There is no abdominal tenderness. There is no guarding or rebound.     Hernia: No hernia is present.  Musculoskeletal:        General: Normal range of motion.     Cervical back: Normal range of motion and neck supple.     Right lower leg: No edema.     Left lower leg: No edema.     Comments: There is 5/5 strength present in the bilateral LE.   Skin:    General: Skin is warm and dry.  Neurological:     Mental Status: She is alert and oriented to person, place, and time.  Psychiatric:        Behavior: Behavior normal.        Thought Content: Thought content normal.    BP 98/68 (BP Location: Right Arm, Patient Position: Sitting, Cuff Size: Normal)   Pulse 61   Temp 98.4 F (36.9 C) (Oral)   Resp 18   Ht 5' 8"  (1.727 m)   Wt 156 lb 12.8 oz (71.1 kg)   SpO2 98%   BMI 23.84 kg/m  Wt Readings from Last 3 Encounters:  04/10/21 156 lb 12.8 oz (71.1 kg)  12/11/11 160 lb (72.6 kg)  12/04/11 162 lb (73.5 kg)     Lab Results  Component Value Date   WBC 8.3 12/04/2011   HGB 14.0 12/04/2011   HCT 41.5 12/04/2011   PLT 261.0 12/04/2011   GLUCOSE 89 12/04/2011   CHOL 181 12/04/2011   TRIG 75.0 12/04/2011   HDL 71.50 12/04/2011   LDLCALC 95 12/04/2011   ALT 21 12/04/2011   AST 26 12/04/2011   NA 141 12/04/2011   K 4.2 12/04/2011   CL 107 12/04/2011   CREATININE 0.7 12/04/2011   BUN 11 12/04/2011   CO2 26 12/04/2011   TSH 1.60 12/04/2011   HGBA1C 5.5 10/13/2010    No results found.   Assessment & Plan:  Plan    Meds ordered this encounter  Medications  . methocarbamol (ROBAXIN) 500 MG tablet    Sig: Take 1 tablet (500 mg total) by mouth 4 (four) times daily.     Dispense:  45 tablet    Refill:  1    Problem List Items Addressed This Visit   None   Visit Diagnoses    Acute left-sided low back pain without sciatica    -  Primary   Relevant Medications   methocarbamol (ROBAXIN) 500 MG tablet   Other  Relevant Orders   DG Lumbar Spine Complete        Can take tylenol too     Xray today-- consider PT   Follow-up: Return in about 3 months (around 07/11/2021), or if symptoms worsen or fail to improve, for annual exam, fasting.   I,Gordon Zheng,acting as a Education administrator for [employer], DO.,have documented all relevant documentation on the behalf of Ann Held, DO,as directed by  Ann Held, DO while in the presence of [place], DO, have reviewed all documentation for this visit. The documentation on 04/10/21 for the exam, diagnosis, procedures, and orders are all accurate and complete.

## 2021-04-10 NOTE — Patient Instructions (Signed)
Acute Back Pain, Adult Acute back pain is sudden and usually short-lived. It is often caused by an injury to the muscles and tissues in the back. The injury may result from:  A muscle or ligament getting overstretched or torn (strained). Ligaments are tissues that connect bones to each other. Lifting something improperly can cause a back strain.  Wear and tear (degeneration) of the spinal disks. Spinal disks are circular tissue that provide cushioning between the bones of the spine (vertebrae).  Twisting motions, such as while playing sports or doing yard work.  A hit to the back.  Arthritis. You may have a physical exam, lab tests, and imaging tests to find the cause of your pain. Acute back pain usually goes away with rest and home care. Follow these instructions at home: Managing pain, stiffness, and swelling  Treatment may include medicines for pain and inflammation that are taken by mouth or applied to the skin, prescription pain medicine, or muscle relaxants. Take over-the-counter and prescription medicines only as told by your health care provider.  Your health care provider may recommend applying ice during the first 24-48 hours after your pain starts. To do this: ? Put ice in a plastic bag. ? Place a towel between your skin and the bag. ? Leave the ice on for 20 minutes, 2-3 times a day.  If directed, apply heat to the affected area as often as told by your health care provider. Use the heat source that your health care provider recommends, such as a moist heat pack or a heating pad. ? Place a towel between your skin and the heat source. ? Leave the heat on for 20-30 minutes. ? Remove the heat if your skin turns bright red. This is especially important if you are unable to feel pain, heat, or cold. You have a greater risk of getting burned. Activity  Do not stay in bed. Staying in bed for more than 1-2 days can delay your recovery.  Sit up and stand up straight. Avoid leaning  forward when you sit or hunching over when you stand. ? If you work at a desk, sit close to it so you do not need to lean over. Keep your chin tucked in. Keep your neck drawn back, and keep your elbows bent at a 90-degree angle (right angle). ? Sit high and close to the steering wheel when you drive. Add lower back (lumbar) support to your car seat, if needed.  Take short walks on even surfaces as soon as you are able. Try to increase the length of time you walk each day.  Do not sit, drive, or stand in one place for more than 30 minutes at a time. Sitting or standing for long periods of time can put stress on your back.  Do not drive or use heavy machinery while taking prescription pain medicine.  Use proper lifting techniques. When you bend and lift, use positions that put less stress on your back: ? Bend your knees. ? Keep the load close to your body. ? Avoid twisting.  Exercise regularly as told by your health care provider. Exercising helps your back heal faster and helps prevent back injuries by keeping muscles strong and flexible.  Work with a physical therapist to make a safe exercise program, as recommended by your health care provider. Do any exercises as told by your physical therapist.   Lifestyle  Maintain a healthy weight. Extra weight puts stress on your back and makes it difficult to have   good posture.  Avoid activities or situations that make you feel anxious or stressed. Stress and anxiety increase muscle tension and can make back pain worse. Learn ways to manage anxiety and stress, such as through exercise. General instructions  Sleep on a firm mattress in a comfortable position. Try lying on your side with your knees slightly bent. If you lie on your back, put a pillow under your knees.  Follow your treatment plan as told by your health care provider. This may include: ? Cognitive or behavioral therapy. ? Acupuncture or massage therapy. ? Meditation or yoga. Contact  a health care provider if:  You have pain that is not relieved with rest or medicine.  You have increasing pain going down into your legs or buttocks.  Your pain does not improve after 2 weeks.  You have pain at night.  You lose weight without trying.  You have a fever or chills. Get help right away if:  You develop new bowel or bladder control problems.  You have unusual weakness or numbness in your arms or legs.  You develop nausea or vomiting.  You develop abdominal pain.  You feel faint. Summary  Acute back pain is sudden and usually short-lived.  Use proper lifting techniques. When you bend and lift, use positions that put less stress on your back.  Take over-the-counter and prescription medicines and apply heat or ice as directed by your health care provider. This information is not intended to replace advice given to you by your health care provider. Make sure you discuss any questions you have with your health care provider. Document Revised: 07/15/2020 Document Reviewed: 07/15/2020 Elsevier Patient Education  2021 [employer]vier Inc[employer]  

## 2021-08-03 IMAGING — DX DG LUMBAR SPINE COMPLETE 4+V
5 series · 5 of 5 positions shown · non-contrast
Comparison: None

CLINICAL DATA: Acute LEFT side lumbar pain, sudden onset 2 months
ago, recently worsened

EXAM:
LUMBAR SPINE - COMPLETE 4+ VIEW

[l-spine ap]
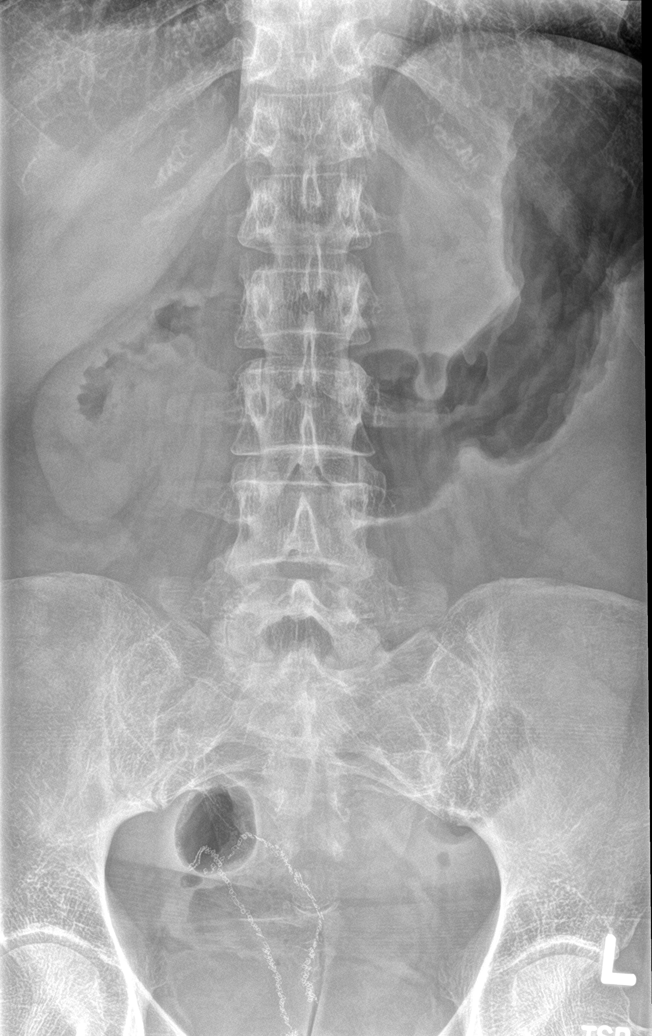

[l-spine obl (1 of 2)]
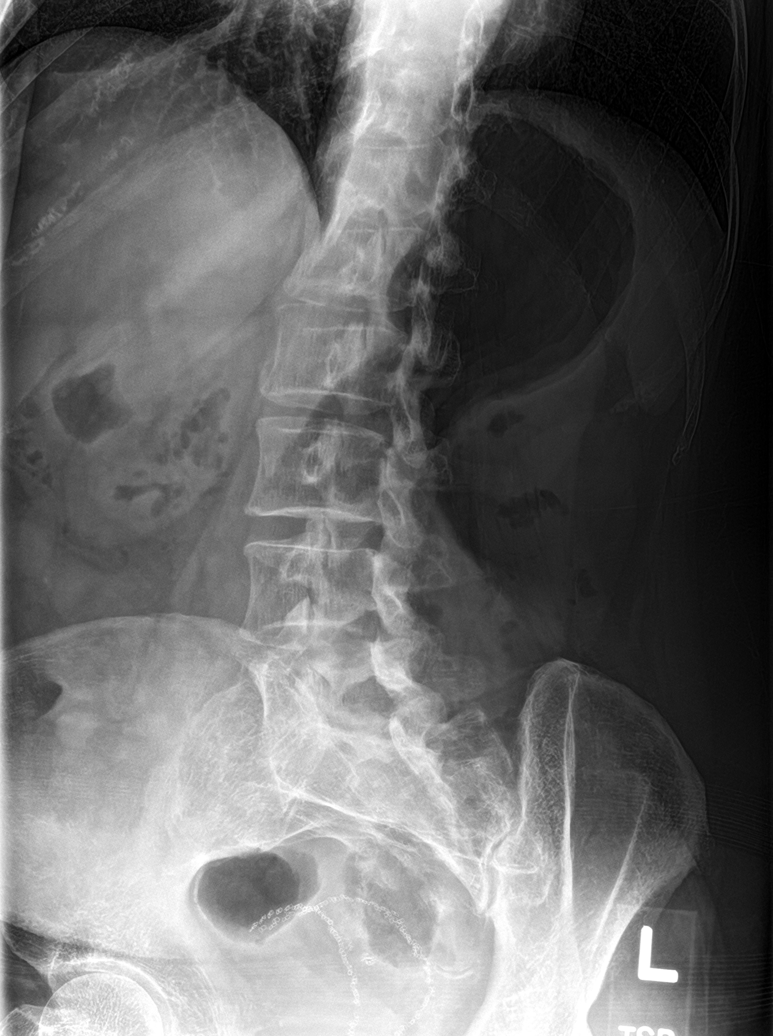

[l-spine obl (2 of 2)]
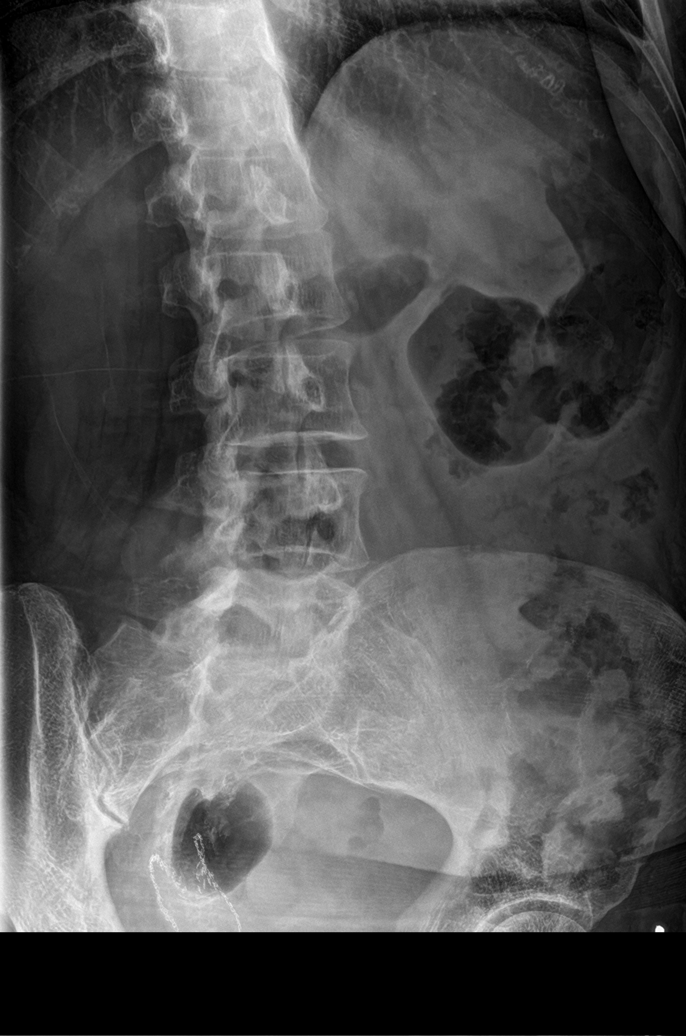

[l-spine lat]
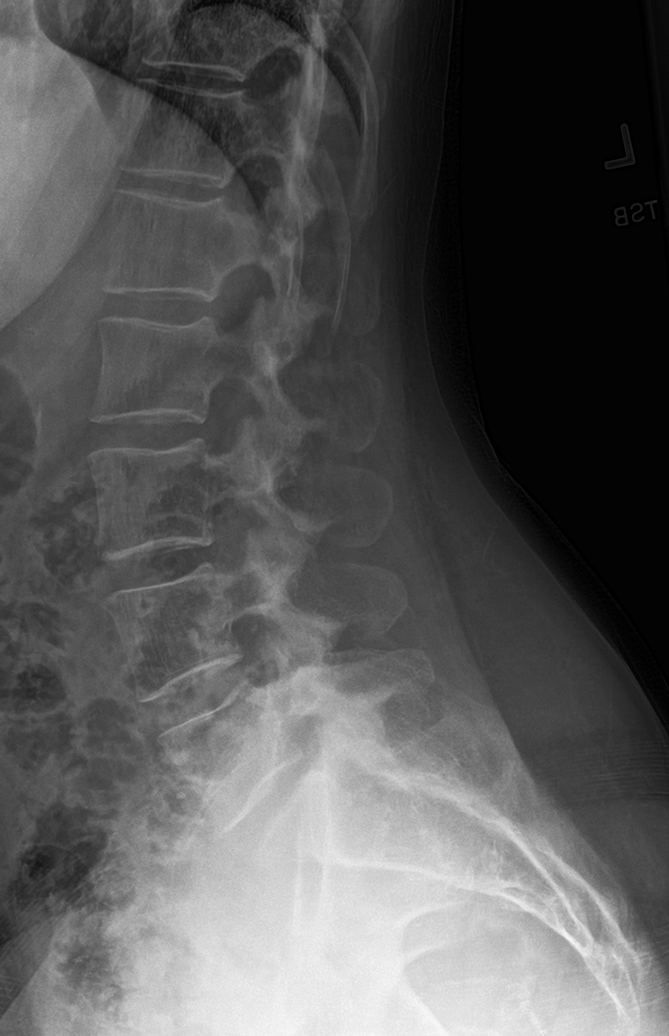

[l-spine spot]
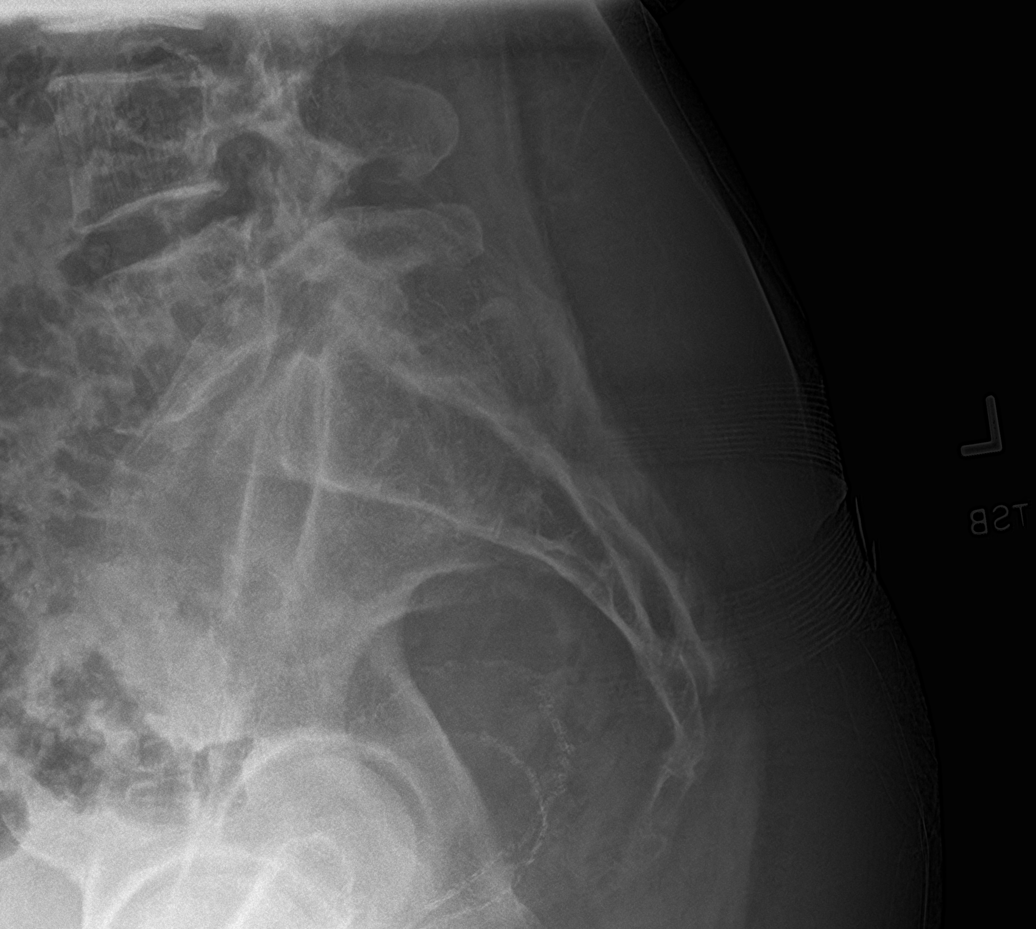

[5 of 5 positions shown; findings below may reference images not displayed]

FINDINGS: Osseous demineralization.

Five non-rib-bearing lumbar vertebra.

Vertebral body and disc space heights maintained.

No fracture, subluxation, or bone destruction.

No spondylolysis.

SI joints preserved.
IMPRESSION: Osseous demineralization.

No acute lumbar spine abnormalities.

## 2021-08-14 ENCOUNTER — Other Ambulatory Visit: Payer: Self-pay

## 2021-08-15 ENCOUNTER — Ambulatory Visit (INDEPENDENT_AMBULATORY_CARE_PROVIDER_SITE_OTHER): Payer: Medicare Other | Admitting: Family Medicine

## 2021-08-15 ENCOUNTER — Encounter: Payer: Self-pay | Admitting: Family Medicine

## 2021-08-15 VITALS — BP 110/80 | HR 55 | Temp 97.8°F | Resp 18 | Ht 68.0 in | Wt 153.8 lb

## 2021-08-15 DIAGNOSIS — I480 Paroxysmal atrial fibrillation: Secondary | ICD-10-CM

## 2021-08-15 DIAGNOSIS — F419 Anxiety disorder, unspecified: Secondary | ICD-10-CM | POA: Diagnosis not present

## 2021-08-15 DIAGNOSIS — Z1159 Encounter for screening for other viral diseases: Secondary | ICD-10-CM

## 2021-08-15 DIAGNOSIS — F418 Other specified anxiety disorders: Secondary | ICD-10-CM | POA: Insufficient documentation

## 2021-08-15 DIAGNOSIS — Z0000 Encounter for general adult medical examination without abnormal findings: Secondary | ICD-10-CM | POA: Diagnosis not present

## 2021-08-15 DIAGNOSIS — L608 Other nail disorders: Secondary | ICD-10-CM | POA: Diagnosis not present

## 2021-08-15 DIAGNOSIS — Z79899 Other long term (current) drug therapy: Secondary | ICD-10-CM | POA: Diagnosis not present

## 2021-08-15 LAB — CBC WITH DIFFERENTIAL/PLATELET
Basophils Absolute: 0 10*3/uL (ref 0.0–0.1)
Basophils Relative: 0.7 % (ref 0.0–3.0)
Eosinophils Absolute: 0.3 10*3/uL (ref 0.0–0.7)
Eosinophils Relative: 4.7 % (ref 0.0–5.0)
HCT: 42.5 % (ref 36.0–46.0)
Hemoglobin: 13.7 g/dL (ref 12.0–15.0)
Lymphocytes Relative: 27.8 % (ref 12.0–46.0)
Lymphs Abs: 1.6 10*3/uL (ref 0.7–4.0)
MCHC: 32.3 g/dL (ref 30.0–36.0)
MCV: 91.3 fl (ref 78.0–100.0)
Monocytes Absolute: 0.4 10*3/uL (ref 0.1–1.0)
Monocytes Relative: 7.3 % (ref 3.0–12.0)
Neutro Abs: 3.5 10*3/uL (ref 1.4–7.7)
Neutrophils Relative %: 59.5 % (ref 43.0–77.0)
Platelets: 259 10*3/uL (ref 150.0–400.0)
RBC: 4.65 Mil/uL (ref 3.87–5.11)
RDW: 13.9 % (ref 11.5–15.5)
WBC: 5.9 10*3/uL (ref 4.0–10.5)

## 2021-08-15 LAB — LIPID PANEL
Cholesterol: 189 mg/dL (ref 0–200)
HDL: 91 mg/dL (ref >39.00)
LDL Cholesterol: 83 mg/dL (ref 0–99)
NonHDL: 97.6
Total CHOL/HDL Ratio: 2
Triglycerides: 72 mg/dL (ref 0.0–149.0)
VLDL: 14.4 mg/dL (ref 0.0–40.0)

## 2021-08-15 LAB — COMPREHENSIVE METABOLIC PANEL
ALT: 17 U/L (ref 0–35)
AST: 24 U/L (ref 0–37)
Albumin: 4.6 g/dL (ref 3.5–5.2)
Alkaline Phosphatase: 72 U/L (ref 39–117)
BUN: 13 mg/dL (ref 6–23)
CO2: 30 mEq/L (ref 19–32)
Calcium: 10.1 mg/dL (ref 8.4–10.5)
Chloride: 102 mEq/L (ref 96–112)
Creatinine, Ser: 0.66 mg/dL (ref 0.40–1.20)
GFR: 95.79 mL/min (ref >60.00)
Glucose, Bld: 87 mg/dL (ref 70–99)
Potassium: 4.2 mEq/L (ref 3.5–5.1)
Sodium: 140 mEq/L (ref 135–145)
Total Bilirubin: 0.7 mg/dL (ref 0.2–1.2)
Total Protein: 7 g/dL (ref 6.0–8.3)

## 2021-08-15 MED ORDER — ESCITALOPRAM OXALATE 10 MG PO TABS: 10.0000 mg | ORAL_TABLET | Freq: Every day | ORAL | 3 refills | Status: DC

## 2021-08-15 MED ORDER — ESCITALOPRAM OXALATE 20 MG PO TABS: 20.0000 mg | ORAL_TABLET | Freq: Every day | ORAL | 3 refills | Status: DC

## 2021-08-15 MED ORDER — CLONAZEPAM 1 MG PO TABS
ORAL_TABLET | ORAL | 1 refills | Status: DC
Start: 2021-08-15 — End: 2021-08-15

## 2021-08-15 MED ORDER — CLONAZEPAM 1 MG PO TABS
ORAL_TABLET | ORAL | 1 refills | Status: DC
Start: 2021-08-15 — End: 2022-08-17

## 2021-08-15 NOTE — Assessment & Plan Note (Signed)
On eliquis Sees cardiology

## 2021-08-15 NOTE — Patient Instructions (Signed)
Preventive Care 40-59 Years Old, Female Preventive care refers to lifestyle choices and visits with your health care provider that can promote health and wellness. This includes: A yearly physical exam. This is also called an annual wellness visit. Regular dental and eye exams. Immunizations. Screening for certain conditions. Healthy lifestyle choices, such as: Eating a healthy diet. Getting regular exercise. Not using drugs or products that contain nicotine and tobacco. Limiting alcohol use. What can I expect for my preventive care visit? Physical exam Your health care provider will check your: Height and weight. These may be used to calculate your BMI (body mass index). BMI is a measurement that tells if you are at a healthy weight. Heart rate and blood pressure. Body temperature. Skin for abnormal spots. Counseling Your health care provider may ask you questions about your: Past medical problems. Family's medical history. Alcohol, tobacco, and drug use. Emotional well-being. Home life and relationship well-being. Sexual activity. Diet, exercise, and sleep habits. Work and work environment. Access to firearms. Method of birth control. Menstrual cycle. Pregnancy history. What immunizations do I need? Vaccines are usually given at various ages, according to a schedule. Your health care provider will recommend vaccines for you based on your age, medical history, and lifestyle or other factors, such as travel or where you work. What tests do I need? Blood tests Lipid and cholesterol levels. These may be checked every 5 years, or more often if you are over 59 years old. Hepatitis C test. Hepatitis B test. Screening Lung cancer screening. You may have this screening every year starting at age 55 if you have a 30-pack-year history of smoking and currently smoke or have quit within the past 15 years. Colorectal cancer screening. All adults should have this screening starting at  age 50 and continuing until age 75. Your health care provider may recommend screening at age 45 if you are at increased risk. You will have tests every 1-10 years, depending on your results and the type of screening test. Diabetes screening. This is done by checking your blood sugar (glucose) after you have not eaten for a while (fasting). You may have this done every 1-3 years. Mammogram. This may be done every 1-2 years. Talk with your health care provider about when you should start having regular mammograms. This may depend on whether you have a family history of breast cancer. BRCA-related cancer screening. This may be done if you have a family history of breast, ovarian, tubal, or peritoneal cancers. Pelvic exam and Pap test. This may be done every 3 years starting at age 21. Starting at age 30, this may be done every 5 years if you have a Pap test in combination with an HPV test. Other tests STD (sexually transmitted disease) testing, if you are at risk. Bone density scan. This is done to screen for osteoporosis. You may have this scan if you are at high risk for osteoporosis. Talk with your health care provider about your test results, treatment options, and if necessary, the need for more tests. Follow these instructions at home: Eating and drinking  Eat a diet that includes fresh fruits and vegetables, whole grains, lean protein, and low-fat dairy products. Take vitamin and mineral supplements as recommended by your health care provider. Do not drink alcohol if: Your health care provider tells you not to drink. You are pregnant, may be pregnant, or are planning to become pregnant. If you drink alcohol: Limit how much you have to 0-1 drink a day. Be   aware of how much alcohol is in your drink. In the [place] one drink equals one 12 oz bottle of beer (355 mL), one 5 oz glass of wine (148 mL), or one 1 oz glass of hard liquor (44 mL). Lifestyle Take daily care of your teeth and  gums. Brush your teeth every morning and night with fluoride toothpaste. Floss one time each day. Stay active. Exercise for at least 30 minutes 5 or more days each week. Do not use any products that contain nicotine or tobacco, such as cigarettes, e-cigarettes, and chewing tobacco. If you need help quitting, ask your health care provider. Do not use drugs. If you are sexually active, practice safe sex. Use a condom or other form of protection to prevent STIs (sexually transmitted infections). If you do not wish to become pregnant, use a form of birth control. If you plan to become pregnant, see your health care provider for a prepregnancy visit. If told by your health care provider, take low-dose aspirin daily starting at age 59. Find healthy ways to cope with stress, such as: Meditation, yoga, or listening to music. Journaling. Talking to a trusted person. Spending time with friends and family. Safety Always wear your seat belt while driving or riding in a vehicle. Do not drive: If you have been drinking alcohol. Do not ride with someone who has been drinking. When you are tired or distracted. While texting. Wear a helmet and other protective equipment during sports activities. If you have firearms in your house, make sure you follow all gun safety procedures. What's next? Visit your health care provider once a year for an annual wellness visit. Ask your health care provider how often you should have your eyes and teeth checked. Stay up to date on all vaccines. This information is not intended to replace advice given to you by your health care provider. Make sure you discuss any questions you have with your health care provider. Document Revised: 12/30/2020 Document Reviewed: 07/03/2018 Elsevier Patient Education  2022 [employer].

## 2021-08-15 NOTE — Progress Notes (Signed)
Subjective:   By signing my name below, I, [name], attest that this documentation has been prepared under the direction and in the presence of Dr. Roma Schanz, DO. 08/15/2021     Patient ID: [name], female    DOB: [birth year], 60 y.o.   MRN: [id number]  Chief Complaint  Patient presents with   Annual Exam    Pt states fasting     HPI Patient is in today for a comprehensive physical exam.  She complains of her left great toenail bruising and peeling due to prior trauma. She has no prior injury to her left great toenail. She is interested in seeing a podiatrist to manage it.  She reports her back pain has improved since last visit. She has not taken muscle relaxer to manage her pain and reports  doing well.  She is requesting a refill on 10 mg lexapro daily PO, 1 mg klonopin PR PRN.  She denies having any fever, new moles, congestion, sore throat, muscle pain, joint pain, chest pain, cough, SOB, wheezing, n/v/d, constipation, blood in stool, dysuria, frequency, hematuria, or headaches at this time. She has no recent changes to her family medical history.  She reports she had to have her kidney stones surgically removed 5 years ago, otherwise she has no recent surgical procedures. Since then she was prescribed 650 mg sodium bicarbonate daily PO. She also continues seeing her urologist regularly.  She reports her last menstrual cycle was 10 years ago. She continues having hot flashes and is taking 10 g lexapro daily PO to manage it but reports that it is only mildly effective. She also reports that 1 mg klonopin PO PRN makes her too drowsy to take regularly.  She is not interested in getting a flu vaccine during this visit. She is due for a tetanus and shringrix vaccine and is willing to get it at her pharmacy. She does not have the Covid-19 vaccine at this time.    Past Medical History:  Diagnosis Date   A-fib [facility])    Depression     Past Surgical History:  Procedure  Laterality Date   BRAIN SURGERY  06/2005   Augmentation    COLON SURGERY  1998   Removal   iliostemy  03/2007   KIDNEY STONE SURGERY N/A    DR Ottie Glazier-- [facility]  urology   SHOULDER SURGERY Left    WRIST SURGERY Left     Family History  Problem Relation Age of Onset   Multiple myeloma Father    Cancer Father 63       multiple myeloma   Ovarian cancer Mother    Emphysema Mother    COPD Mother    Hypertension Mother    Obesity Mother    Depression Mother    Diabetes Mother    Cancer Mother 41       ovarian   Anxiety disorder Other    Arthritis Brother     Social History   Socioeconomic History   Marital status: Married    Spouse name: Not on file   Number of children: Not on file   Years of education: Not on file   Highest education level: Not on file  Occupational History   Occupation: not working    Employer: [employer]   Occupation: Arboriculturist     Comment: on disability  Tobacco Use   Smoking status: Never   Smokeless tobacco: Never  Substance and Sexual Activity   Alcohol use: No  Drug use: No   Sexual activity: Not Currently    Partners: Male  Other Topics Concern   Not on file  Social History Narrative   Not on file   Social Determinants of Health   Financial Resource Strain: Not on file  Food Insecurity: Not on file  Transportation Needs: Not on file  Physical Activity: Not on file  Stress: Not on file  Social Connections: Not on file  Intimate Partner Violence: Not on file    Outpatient Medications Prior to Visit  Medication Sig Dispense Refill   apixaban (ELIQUIS) 5 MG TABS tablet Take by mouth.     sodium bicarbonate 650 MG tablet Take 650 mg by mouth 2 (two) times daily.     clonazePAM (KLONOPIN) 1 MG tablet 1 po qhs prn 30 tablet 1   escitalopram (LEXAPRO) 10 MG tablet Take 1 tablet by mouth daily.     methocarbamol (ROBAXIN) 500 MG tablet Take 1 tablet (500 mg total) by mouth 4 (four) times daily. (Patient not taking: Reported  on 08/15/2021) 45 tablet 1   No facility-administered medications prior to visit.    Allergies  Allergen Reactions   Codeine    Hydrocodone-Acetaminophen     Review of Systems  Constitutional:  Negative for fever.  HENT:  Negative for congestion and sore throat.   Respiratory:  Negative for cough, shortness of breath and wheezing.   Cardiovascular:  Negative for chest pain.  Gastrointestinal:  Negative for blood in stool, constipation, diarrhea, nausea and vomiting.  Genitourinary:  Negative for dysuria, frequency and hematuria.  Musculoskeletal:  Negative for joint pain and myalgias.  Skin:        (-)New moles  Neurological:  Negative for headaches.  Endo/Heme/Allergies:        (+)discolored/Peeling left great toenail      Objective:    Physical Exam Constitutional:      General: She is not in acute distress.    Appearance: Normal appearance. She is not ill-appearing.  HENT:     Head: Normocephalic and atraumatic.     Right Ear: Tympanic membrane, ear canal and external ear normal.     Left Ear: Tympanic membrane, ear canal and external ear normal.  Eyes:     Extraocular Movements: Extraocular movements intact.     Pupils: Pupils are equal, round, and reactive to light.  Cardiovascular:     Rate and Rhythm: Normal rate. Rhythm irregular.     Heart sounds: Normal heart sounds. No murmur heard.   No gallop.     Comments: She as known history of A-fib and is regularly seeing a cardiologist to manage it.  Pulmonary:     Effort: Pulmonary effort is normal. No respiratory distress.     Breath sounds: Normal breath sounds. No wheezing or rales.  Abdominal:     General: Bowel sounds are normal. There is no distension.     Palpations: Abdomen is soft.     Tenderness: There is no abdominal tenderness. There is no guarding.  Skin:    General: Skin is warm and dry.     Comments: Patient reports discolored left great toenail bed and peeling, cannot observe due to nail polish  cover great toe.   Neurological:     Mental Status: She is alert and oriented to person, place, and time.  Psychiatric:        Behavior: Behavior normal.        Judgment: Judgment normal.    BP 110/80 (BP  Location: Left Arm, Patient Position: Sitting, Cuff Size: Normal)   Pulse (!) 55   Temp 97.8 F (36.6 C) (Oral)   Resp 18   Ht 5' 8" (1.727 m)   Wt 153 lb 12.8 oz (69.8 kg)   SpO2 97%   BMI 23.39 kg/m  Wt Readings from Last 3 Encounters:  08/15/21 153 lb 12.8 oz (69.8 kg)  04/10/21 156 lb 12.8 oz (71.1 kg)  12/11/11 160 lb (72.6 kg)    Diabetic Foot Exam - Simple   No data filed    Lab Results  Component Value Date   WBC 8.3 12/04/2011   HGB 14.0 12/04/2011   HCT 41.5 12/04/2011   PLT 261.0 12/04/2011   GLUCOSE 89 12/04/2011   CHOL 181 12/04/2011   TRIG 75.0 12/04/2011   HDL 71.50 12/04/2011   LDLCALC 95 12/04/2011   ALT 21 12/04/2011   AST 26 12/04/2011   NA 141 12/04/2011   K 4.2 12/04/2011   CL 107 12/04/2011   CREATININE 0.7 12/04/2011   BUN 11 12/04/2011   CO2 26 12/04/2011   TSH 1.60 12/04/2011   HGBA1C 5.5 10/13/2010    Lab Results  Component Value Date   TSH 1.60 12/04/2011   Lab Results  Component Value Date   WBC 8.3 12/04/2011   HGB 14.0 12/04/2011   HCT 41.5 12/04/2011   MCV 94.3 12/04/2011   PLT 261.0 12/04/2011   Lab Results  Component Value Date   NA 141 12/04/2011   K 4.2 12/04/2011   CO2 26 12/04/2011   GLUCOSE 89 12/04/2011   BUN 11 12/04/2011   CREATININE 0.7 12/04/2011   BILITOT 0.5 12/04/2011   ALKPHOS 48 12/04/2011   AST 26 12/04/2011   ALT 21 12/04/2011   PROT 7.3 12/04/2011   ALBUMIN 4.2 12/04/2011   CALCIUM 9.3 12/04/2011   GFR 97.32 12/04/2011   Lab Results  Component Value Date   CHOL 181 12/04/2011   Lab Results  Component Value Date   HDL 71.50 12/04/2011   Lab Results  Component Value Date   LDLCALC 95 12/04/2011   Lab Results  Component Value Date   TRIG 75.0 12/04/2011   Lab Results   Component Value Date   CHOLHDL 3 12/04/2011   Lab Results  Component Value Date   HGBA1C 5.5 10/13/2010   Mammogram- Last completed 10/20/2012. Results are normal. Repeat in 1 year.  Colonoscopy- Last completed 12/22/2003.  Pap smear- Last completed 04/20/2011.      Assessment & Plan:   Problem List Items Addressed This Visit       Unprioritized   Depression with anxiety    Stable Refill meds       Relevant Medications   escitalopram (LEXAPRO) 20 MG tablet   Paroxysmal atrial fibrillation (HCC)    On eliquis Sees cardiology      Relevant Orders   CBC with Differential/Platelet   Comprehensive metabolic panel   Lipid panel   Preventative health care - Primary    ghm utd Check labs  See avs Pt refused flu and covid  She will get shingrix/ td at pharmacy      Other Visit Diagnoses     Anxiety       Relevant Medications   clonazePAM (KLONOPIN) 1 MG tablet   escitalopram (LEXAPRO) 20 MG tablet   Need for hepatitis C screening test       Relevant Orders   Hepatitis C antibody   Toenail deformity  Relevant Orders   Ambulatory referral to Podiatry   High risk medication use       Relevant Orders   Drug Monitoring Panel [phone] , Urine        Meds ordered this encounter  Medications   DISCONTD: escitalopram (LEXAPRO) 10 MG tablet    Sig: Take 1 tablet (10 mg total) by mouth daily.    Dispense:  90 tablet    Refill:  3   DISCONTD: clonazePAM (KLONOPIN) 1 MG tablet    Sig: 1 po qhs prn    Dispense:  30 tablet    Refill:  1   clonazePAM (KLONOPIN) 1 MG tablet    Sig: 1 po qhs prn    Dispense:  30 tablet    Refill:  1   escitalopram (LEXAPRO) 20 MG tablet    Sig: Take 1 tablet (20 mg total) by mouth daily.    Dispense:  90 tablet    Refill:  3    I, Dr. Roma Schanz, DO, personally preformed the services described in this documentation.  All medical record entries made by the scribe were at my direction and in my presence.  I have  reviewed the chart and discharge instructions (if applicable) and agree that the record reflects my personal performance and is accurate and complete. 08/15/2021   I,[name],acting as a scribe for Ann Held, DO.,have documented all relevant documentation on the behalf of Ann Held, DO,as directed by  Ann Held, DO while in the presence of Ann Held, DO.   Ann Held, DO

## 2021-08-15 NOTE — Assessment & Plan Note (Signed)
Stable. Refill meds

## 2021-08-15 NOTE — Assessment & Plan Note (Signed)
ghm utd Check labs  See avs Pt refused flu and covid  She will get shingrix/ td at pharmacy

## 2021-08-16 LAB — HEPATITIS C ANTIBODY
Hepatitis C Ab: NONREACTIVE
SIGNAL TO CUT-OFF: <0.02 (ref <1.00)

## 2021-08-18 LAB — DRUG MONITORING PANEL 376104, URINE
Amphetamines: NEGATIVE ng/mL (ref <500)
Barbiturates: NEGATIVE ng/mL (ref <300)
Benzodiazepines: NEGATIVE ng/mL (ref <100)
Cocaine Metabolite: NEGATIVE ng/mL (ref <150)
Desmethyltramadol: NEGATIVE ng/mL (ref <100)
Opiates: NEGATIVE ng/mL (ref <100)
Oxycodone: NEGATIVE ng/mL (ref <100)
Tramadol: NEGATIVE ng/mL (ref <100)

## 2021-08-18 LAB — DM TEMPLATE

## 2021-08-22 ENCOUNTER — Encounter: Payer: Self-pay | Admitting: Podiatry

## 2021-08-22 ENCOUNTER — Ambulatory Visit: Payer: Medicare Other | Admitting: Podiatry

## 2021-08-22 ENCOUNTER — Other Ambulatory Visit: Payer: Self-pay

## 2021-08-22 DIAGNOSIS — L603 Nail dystrophy: Secondary | ICD-10-CM

## 2021-08-22 DIAGNOSIS — L601 Onycholysis: Secondary | ICD-10-CM

## 2021-08-22 DIAGNOSIS — B351 Tinea unguium: Secondary | ICD-10-CM | POA: Diagnosis not present

## 2021-08-23 NOTE — Progress Notes (Signed)
  Subjective:  Patient ID: [name], female    DOB: [birth year],  MRN: [id number]  Chief Complaint  Patient presents with   Nail Problem    NP    Toenail deformity  Referring Provider: Ann Held     60 y.o. female presents with the above complaint. History confirmed with patient.  This is a recurrent issue she had several years ago, the nail fell off regrew and is starting to worsen again  Objective:  Physical Exam: warm, good capillary refill, no trophic changes or ulcerative lesions, normal DP and PT pulses, and normal sensory exam. Left Foot: normal exam, no swelling, tenderness, instability; ligaments intact, full range of motion of all ankle/foot joints Right Foot:  Dystrophic hallux nail with onycholysis and yellow-green discoloration  Assessment:   1. Nail dystrophy   2. Onycholysis      Plan:  Patient was evaluated and treated and all questions answered.  Unclear what the exact etiology of the onycholysis is could be fungal infection, bacterial infection or microtrauma.  I recommended that we take a sample of the nail plate today and will be sent to [facility] pathology and we will determine the best course of action for her after I will message her with results when I receive them  Return for will schedule after lab results back .

## 2021-08-24 ENCOUNTER — Encounter: Payer: Self-pay | Admitting: Family Medicine

## 2021-08-25 NOTE — Telephone Encounter (Signed)
Please advise 

## 2021-08-27 ENCOUNTER — Other Ambulatory Visit: Payer: Self-pay | Admitting: Family Medicine

## 2021-08-27 DIAGNOSIS — F419 Anxiety disorder, unspecified: Secondary | ICD-10-CM

## 2021-08-27 MED ORDER — COVID-19 AT HOME ANTIGEN TEST VI KIT: PACK | 0 refills | Status: DC

## 2021-08-27 MED ORDER — ESCITALOPRAM OXALATE 20 MG PO TABS: 20.0000 mg | ORAL_TABLET | Freq: Every day | ORAL | 3 refills | Status: DC

## 2021-09-08 ENCOUNTER — Encounter: Payer: Self-pay | Admitting: Podiatry

## 2021-11-16 ENCOUNTER — Encounter: Payer: Self-pay | Admitting: Podiatry

## 2021-11-17 MED ORDER — TERBINAFINE HCL 250 MG PO TABS: 250.0000 mg | ORAL_TABLET | Freq: Every day | ORAL | 0 refills | Status: DC

## 2021-11-27 ENCOUNTER — Other Ambulatory Visit (HOSPITAL_BASED_OUTPATIENT_CLINIC_OR_DEPARTMENT_OTHER): Payer: Self-pay

## 2021-11-27 ENCOUNTER — Ambulatory Visit (INDEPENDENT_AMBULATORY_CARE_PROVIDER_SITE_OTHER): Payer: Medicare Other | Admitting: Family

## 2021-11-27 VITALS — BP 123/69 | HR 77 | Temp 99.4°F | Resp 16 | Wt 150.2 lb

## 2021-11-27 DIAGNOSIS — U071 COVID-19: Secondary | ICD-10-CM | POA: Insufficient documentation

## 2021-11-27 MED ORDER — MOLNUPIRAVIR 200 MG PO CAPS
4.0000 | ORAL_CAPSULE | Freq: Two times a day (BID) | ORAL | 0 refills | Status: AC
  Filled 2021-11-27: qty 40, 5d supply, fill #0

## 2021-11-27 NOTE — Patient Instructions (Signed)
Please begin Molnupiravir 4 capsules twice daily. Drink at least 64 oz of water a day, more if your urine is not a light yellow color. For pain/fever you can use tylenol as needed. For cough you can use over the counter delsym.

## 2021-11-27 NOTE — Progress Notes (Signed)
Subjective:     Patient ID: [name], female    DOB: 31-Jul-1961, 61 y.o.   MRN: [id number]  Chief Complaint  Patient presents with   Generalized Body Aches    Complains of body aches since last night    Nasal Congestion    Complains of congestion since yesterday   Fever    Reports a temperature of 100.1 this morning     Fever    Notes scratchy throat started Saturday night.  Woke up at 11 PM last night with chills, congestion, dry cough, body aches.  She is unaware of sick exposures. Not vaccinated against flu or covid.   Health Maintenance Due  Topic Date Due   COVID-19 Vaccine (1) Never done   HIV Screening  Never done   Zoster Vaccines- Shingrix (1 of 2) Never done   TETANUS/TDAP  12/16/2012   PAP SMEAR-Modifier  04/19/2014   MAMMOGRAM  10/20/2014    Past Medical History:  Diagnosis Date   A-fib [facility])    Depression     Past Surgical History:  Procedure Laterality Date   BRAIN SURGERY  06/2005   Augmentation    COLON SURGERY  1998   Removal   iliostemy  03/2007   KIDNEY STONE SURGERY N/A    DR Ottie Glazier-- [facility]  urology   SHOULDER SURGERY Left    WRIST SURGERY Left     Family History  Problem Relation Age of Onset   Multiple myeloma Father    Cancer Father 42       multiple myeloma   Ovarian cancer Mother    Emphysema Mother    COPD Mother    Hypertension Mother    Obesity Mother    Depression Mother    Diabetes Mother    Cancer Mother 21       ovarian   Anxiety disorder Other    Arthritis Brother     Social History   Socioeconomic History   Marital status: Married    Spouse name: Not on file   Number of children: Not on file   Years of education: Not on file   Highest education level: Not on file  Occupational History   Occupation: not working    Fish farm manager: [employer]   Occupation: Arboriculturist     Comment: on disability  Tobacco Use   Smoking status: Never   Smokeless tobacco: Never  Substance and Sexual Activity    Alcohol use: No   Drug use: No   Sexual activity: Not Currently    Partners: Male  Other Topics Concern   Not on file  Social History Narrative   Not on file   Social Determinants of Health   Financial Resource Strain: Not on file  Food Insecurity: Not on file  Transportation Needs: Not on file  Physical Activity: Not on file  Stress: Not on file  Social Connections: Not on file  Intimate Partner Violence: Not on file    Outpatient Medications Prior to Visit  Medication Sig Dispense Refill   apixaban (ELIQUIS) 5 MG TABS tablet Take by mouth.     clonazePAM (KLONOPIN) 1 MG tablet 1 po qhs prn 30 tablet 1   escitalopram (LEXAPRO) 20 MG tablet Take 1 tablet (20 mg total) by mouth daily. 90 tablet 3   hyoscyamine (LEVBID) 0.375 MG 12 hr tablet TAKE 1 TABLET (0.375 MG TOTAL) BY MOUTH EVERY TWELVE (12) HOURS AS NEEDED FOR CRAMPING.     metroNIDAZOLE (FLAGYL) 500  MG tablet Take by mouth.     sodium bicarbonate 650 MG tablet Take 650 mg by mouth 2 (two) times daily.     terbinafine (LAMISIL) 250 MG tablet Take 1 tablet (250 mg total) by mouth daily. 90 tablet 0   atenolol (TENORMIN) 25 MG tablet Take by mouth.     clonazePAM (KLONOPIN) 1 MG tablet Take by mouth.     conjugated estrogens (PREMARIN) vaginal cream USE 0.5GRAMS 2-3 TIMES PER WEEK     COVID-19 At Home Antigen Test KIT Use as directed. 4 kit 0   meloxicam (MOBIC) 15 MG tablet Take by mouth.     No facility-administered medications prior to visit.    Allergies  Allergen Reactions   Estradiol Other (See Comments)    inflamed tissue    Codeine    Hydrocodone-Acetaminophen    Ciprofloxacin Hcl Itching    Possible allergy    Review of Systems  Constitutional:  Positive for fever.      Objective:    Physical Exam Constitutional:      General: She is not in acute distress.    Appearance: Normal appearance. She is well-developed.  HENT:     Head: Normocephalic and atraumatic.     Right Ear: Tympanic membrane,  ear canal and external ear normal.     Left Ear: Tympanic membrane, ear canal and external ear normal.  Eyes:     General: No scleral icterus. Neck:     Thyroid: No thyromegaly.  Cardiovascular:     Rate and Rhythm: Normal rate and regular rhythm.     Heart sounds: Normal heart sounds. No murmur heard. Pulmonary:     Effort: Pulmonary effort is normal. No respiratory distress.     Breath sounds: Normal breath sounds. No wheezing.  Musculoskeletal:     Cervical back: Neck supple. No tenderness.  Lymphadenopathy:     Cervical: No cervical adenopathy.  Skin:    General: Skin is warm and dry.  Neurological:     Mental Status: She is alert and oriented to person, place, and time.  Psychiatric:        Mood and Affect: Mood normal.        Behavior: Behavior normal.        Thought Content: Thought content normal.        Judgment: Judgment normal.    BP 123/69 (BP Location: Right Arm, Patient Position: Sitting, Cuff Size: Small)    Pulse 77    Temp 99.4 F (37.4 C) (Oral)    Resp 16    Wt 150 lb 3.2 oz (68.1 kg)    SpO2 100%    BMI 22.84 kg/m  Wt Readings from Last 3 Encounters:  11/27/21 150 lb 3.2 oz (68.1 kg)  08/15/21 153 lb 12.8 oz (69.8 kg)  04/10/21 156 lb 12.8 oz (71.1 kg)       Assessment & Plan:   Problem List Items Addressed This Visit       Unprioritized   COVID-19 virus infection - Primary    New.Rapid covid test is positive. Rapid flu is negative.  Will rx with molnupiravir. Pt also advised as follows:  Drink at least 64 oz of water a day, more if your urine is not a light yellow color. For pain/fever you can use tylenol as needed. For cough you can use over the counter delsym.    Advised of CDC guidelines for self isolation/ ending isolation.  Advised of safe practice guidelines. Symptom Tier reviewed.  Encouraged to monitor for any worsening symptoms; watch for increased shortness of breath, weakness, and signs of dehydration. Advised when to seek emergency  care.  Instructed to rest and hydrate well.  Advised to leave the house during recommended isolation period, only if it is necessary to seek medical care. I also encouraged pt to consider future covid-19 vaccination.        Relevant Medications   molnupiravir EUA (LAGEVRIO) 200 MG CAPS capsule    I have discontinued Akayla C. Mcfarland's atenolol, conjugated estrogens, meloxicam, and COVID-19 At Home Antigen Test. I am also having her start on molnupiravir EUA. Additionally, I am having her maintain her apixaban, sodium bicarbonate, clonazePAM, hyoscyamine, metroNIDAZOLE, escitalopram, and terbinafine.  Meds ordered this encounter  Medications   molnupiravir EUA (LAGEVRIO) 200 MG CAPS capsule    Sig: Take 4 capsules (800 mg total) by mouth in the morning and at bedtime for 5 days.    Dispense:  40 capsule    Refill:  0    Order Specific Question:   Supervising Provider    Answer:   Penni Homans A [[id number]]

## 2021-11-27 NOTE — Assessment & Plan Note (Addendum)
New.Rapid covid test is positive. Rapid flu is negative.  Will rx with molnupiravir. Pt also advised as follows:  Drink at least 64 oz of water a day, more if your urine is not a light yellow color. For pain/fever you can use tylenol as needed. For cough you can use over the counter delsym.    Advised of CDC guidelines for self isolation/ ending isolation.  Advised of safe practice guidelines. Symptom Tier reviewed.  Encouraged to monitor for any worsening symptoms; watch for increased shortness of breath, weakness, and signs of dehydration. Advised when to seek emergency care.  Instructed to rest and hydrate well.  Advised to leave the house during recommended isolation period, only if it is necessary to seek medical care. I also encouraged pt to consider future covid-19 vaccination.

## 2021-11-28 ENCOUNTER — Telehealth: Payer: Self-pay | Admitting: Family Medicine

## 2021-11-28 NOTE — Telephone Encounter (Signed)
Left message for patient to call back and schedule Medicare Annual Wellness Visit (AWV) in office.  ° °If not able to come in office, please offer to do virtually or by telephone.  Left office number and my jabber #[phone]. ° °Due for AWVI ° °Please schedule at anytime with Nurse Health Advisor. °  °

## 2021-12-29 DIAGNOSIS — Z1231 Encounter for screening mammogram for malignant neoplasm of breast: Secondary | ICD-10-CM | POA: Diagnosis not present

## 2021-12-29 LAB — HM MAMMOGRAPHY

## 2022-01-04 DIAGNOSIS — N200 Calculus of kidney: Secondary | ICD-10-CM | POA: Diagnosis not present

## 2022-01-11 DIAGNOSIS — L578 Other skin changes due to chronic exposure to nonionizing radiation: Secondary | ICD-10-CM | POA: Diagnosis not present

## 2022-01-11 DIAGNOSIS — L814 Other melanin hyperpigmentation: Secondary | ICD-10-CM | POA: Diagnosis not present

## 2022-01-11 DIAGNOSIS — L821 Other seborrheic keratosis: Secondary | ICD-10-CM | POA: Diagnosis not present

## 2022-01-11 DIAGNOSIS — D225 Melanocytic nevi of trunk: Secondary | ICD-10-CM | POA: Diagnosis not present

## 2022-01-16 ENCOUNTER — Ambulatory Visit (INDEPENDENT_AMBULATORY_CARE_PROVIDER_SITE_OTHER): Payer: Medicare Other

## 2022-01-16 VITALS — BP 106/60 | HR 65 | Temp 97.8°F | Ht 68.0 in | Wt 147.0 lb

## 2022-01-16 DIAGNOSIS — Z0000 Encounter for general adult medical examination without abnormal findings: Secondary | ICD-10-CM

## 2022-01-16 NOTE — Progress Notes (Signed)
? ?Subjective:  ? [name] is a 61 y.o. female who presents for Medicare Annual (Subsequent) preventive examination. ? ?Review of Systems    ? ?Cardiac Risk Factors include: advanced age (>68mn, >>75women) ? ?   ?Objective:  ?  ?Today's Vitals  ? 01/16/22 1308  ?BP: 106/60  ?Pulse: 65  ?Temp: 97.8 ?F (36.6 ?C)  ?SpO2: 98%  ?Weight: 147 lb (66.7 kg)  ?Height: 5' 8"  (1.727 m)  ? ?Body mass index is 22.35 kg/m?. ? ?Advanced Directives 01/16/2022  ?Does Patient Have a Medical Advance Directive? No  ?Would patient like information on creating a medical advance directive? No - Patient declined  ? ? ?Current Medications (verified) ?Outpatient Encounter Medications as of 01/16/2022  ?Medication Sig  ? apixaban (ELIQUIS) 5 MG TABS tablet Take by mouth.  ? escitalopram (LEXAPRO) 20 MG tablet Take 1 tablet (20 mg total) by mouth daily.  ? metroNIDAZOLE (FLAGYL) 500 MG tablet Take by mouth.  ? sodium bicarbonate 650 MG tablet Take 650 mg by mouth 2 (two) times daily.  ? terbinafine (LAMISIL) 250 MG tablet Take 1 tablet (250 mg total) by mouth daily.  ? clonazePAM (KLONOPIN) 1 MG tablet 1 po qhs prn (Patient not taking: Reported on 01/16/2022)  ? [DISCONTINUED] hyoscyamine (LEVBID) 0.375 MG 12 hr tablet TAKE 1 TABLET (0.375 MG TOTAL) BY MOUTH EVERY TWELVE (12) HOURS AS NEEDED FOR CRAMPING.  ? ?No facility-administered encounter medications on file as of 01/16/2022.  ? ? ?Allergies (verified) ?Estradiol, Codeine, Hydrocodone-acetaminophen, and Ciprofloxacin hcl  ? ?History: ?Past Medical History:  ?Diagnosis Date  ? A-fib ([facility] [place]   ? Depression   ? ?Past Surgical History:  ?Procedure Laterality Date  ? BRAIN SURGERY  06/2005  ? Augmentation   ? COLON SURGERY  1998  ? Removal  ? iliostemy  03/2007  ? KIDNEY STONE SURGERY N/A   ? [facility]  ? SHOULDER SURGERY Left   ? WRIST SURGERY Left   ? ?Family History  ?Problem Relation Age of Onset  ? Multiple myeloma Father   ? Cancer Father 571 ?     multiple myeloma   ? Ovarian cancer Mother   ? Emphysema Mother   ? COPD Mother   ? Hypertension Mother   ? Obesity Mother   ? Depression Mother   ? Diabetes Mother   ? Cancer Mother 631 ?     ovarian  ? Anxiety disorder Other   ? Arthritis Brother   ? ?Social History  ? ?Socioeconomic History  ? Marital status: Married  ?  Spouse name: Not on file  ? Number of children: Not on file  ? Years of education: Not on file  ? Highest education level: Not on file  ?Occupational History  ? Occupation: not working  ?  Employer: [employer]  ? Occupation: dental hygenist   ?  Comment: on disability  ?Tobacco Use  ? Smoking status: Never  ? Smokeless tobacco: Never  ?Substance and Sexual Activity  ? Alcohol use: No  ? Drug use: No  ? Sexual activity: Not Currently  ?  Partners: Male  ?Other Topics Concern  ? Not on file  ?Social History Narrative  ? Not on file  ? ?Social Determinants of Health  ? ?Financial Resource Strain: Low Risk   ? Difficulty of Paying Living Expenses: Not hard at all  ?Food Insecurity: No Food Insecurity  ? Worried About RCharity fundraiserin the Last Year:  Never true  ? Ran Out of Food in the Last Year: Never true  ?Transportation Needs: No Transportation Needs  ? Lack of Transportation (Medical): No  ? Lack of Transportation (Non-Medical): No  ?Physical Activity: Sufficiently Active  ? Days of Exercise per Week: 7 days  ? Minutes of Exercise per Session: 60 min  ?Stress: No Stress Concern Present  ? Feeling of Stress : Not at all  ?Social Connections: Moderately Integrated  ? Frequency of Communication with Friends and Family: More than three times a week  ? Frequency of Social Gatherings with Friends and Family: Once a week  ? Attends Religious Services: Never  ? Active Member of Clubs or Organizations: Yes  ? Attends Archivist Meetings: More than 4 times per year  ? Marital Status: Married  ? ? ?Tobacco Counseling ?Counseling given: Not Answered ? ? ?Clinical Intake: ? ?Pre-visit preparation completed:  Yes ? ?Pain : No/denies pain ? ?  ? ?BMI - recorded: 22.35 ?Nutritional Status: BMI of 19-24  Normal ?Nutritional Risks: None ?Diabetes: No ? ?How often do you need to have someone help you when you read instructions, pamphlets, or other written materials from your doctor or pharmacy?: 1 - Never ? ?Diabetic?No ? ?Interpreter Needed?: No ? ?Information entered by :: Adelynne Joerger ? ? ?Activities of Daily Living ?In your present state of health, do you have any difficulty performing the following activities: 01/16/2022  ?Hearing? N  ?Vision? N  ?Difficulty concentrating or making decisions? N  ?Walking or climbing stairs? N  ?Dressing or bathing? N  ?Doing errands, shopping? N  ?Preparing Food and eating ? N  ?Using the Toilet? N  ?In the past six months, have you accidently leaked urine? N  ?Do you have problems with loss of bowel control? N  ?Managing your Medications? N  ?Managing your Finances? N  ?Housekeeping or managing your Housekeeping? N  ?Some recent data might be hidden  ? ? ?Patient Care Team: ?Carollee Herter, Alferd Apa, DO as PCP - General ?Lovell Sheehan, NP as Nurse Practitioner (Nurse Practitioner) ?Bonney Aid, MD as Referring Physician (Cardiology) ?Neil Crouch, MD (Urology) ?Clementeen Graham, MD as Referring Physician (Gastroenterology) ? ?Indicate any recent Medical Services you may have received from other than [facility] providers in the past year (date may be approximate). ? ?   ?Assessment:  ? This is a routine wellness examination for [name]. ? ?Hearing/Vision screen ?No results found. ? ?Dietary issues and exercise activities discussed: ?Current Exercise Habits: Home exercise routine, Type of exercise: walking, Time (Minutes): 60, Frequency (Times/Week): 7, Weekly Exercise (Minutes/Week): 420, Intensity: Mild ? ? Goals Addressed   ?None ?  ? ?Depression Screen ?PHQ 2/9 Scores 01/16/2022 08/15/2021  ?PHQ - 2 Score 0 0  ?  ?Fall Risk ?Fall Risk  01/16/2022 08/15/2021  ?Falls in the past year? 0  0  ?Number falls in past yr: 0 0  ?Injury with Fall? 0 0  ?Risk for fall due to : No Fall Risks -  ?Follow up Falls evaluation completed Falls evaluation completed  ? ? ?FALL RISK PREVENTION PERTAINING TO THE HOME: ? ?Any stairs in or around the home? Yes  ?If so, are there any without handrails? No  ?Home free of loose throw rugs in walkways, pet beds, electrical cords, etc? Yes  ?Adequate lighting in your home to reduce risk of falls? Yes  ? ?ASSISTIVE DEVICES UTILIZED TO PREVENT FALLS: ? ?Life alert? No  ?Use of a cane, walker or  w/c? No  ?Grab bars in the bathroom? No  ?Shower chair or bench in shower? No  ?Elevated toilet seat or a handicapped toilet? No  ? ?TIMED UP AND GO: ? ?Was the test performed? Yes .  ?Length of time to ambulate 10 feet: 9 sec.  ? ?Gait steady and fast without use of assistive device ? ?Cognitive Function: ?  ?  ?6CIT Screen 01/16/2022  ?What Year? 0 points  ?What month? 0 points  ?What time? 0 points  ?Count back from 20 0 points  ?Months in reverse 0 points  ?Repeat phrase 0 points  ?Total Score 0  ? ? ?Immunizations ?Immunization History  ?Administered Date(s) Administered  ? Td 12/16/2002  ? ? ?TDAP status: Up to date ? ?Flu Vaccine status: Declined, Education has been provided regarding the importance of this vaccine but patient still declined. Advised may receive this vaccine at local pharmacy or Health Dept. Aware to provide a copy of the vaccination record if obtained from local pharmacy or Health Dept. Verbalized acceptance and understanding. ? ?Pneumococcal vaccine status: Declined,  Education has been provided regarding the importance of this vaccine but patient still declined. Advised may receive this vaccine at local pharmacy or Health Dept. Aware to provide a copy of the vaccination record if obtained from local pharmacy or Health Dept. Verbalized acceptance and understanding.  ? ?Covid-19 vaccine status: Declined, Education has been provided regarding the importance of this  vaccine but patient still declined. Advised may receive this vaccine at local pharmacy or Health Dept.or vaccine clinic. Aware to provide a copy of the vaccination record if obtained from local pharmacy

## 2022-01-16 NOTE — Patient Instructions (Signed)
[name] , ?Thank you for taking time to come for your Medicare Wellness Visit. I appreciate your ongoing commitment to your health goals. Please review the following plan we discussed and let me know if I can assist you in the future.  ? ?Screening recommendations/referrals: ?Colonoscopy: No longer needed, no colon ?Mammogram: 12/29/21 due 12/30/23 ?Bone Density: N/A ?Recommended yearly ophthalmology/optometry visit for glaucoma screening and checkup ?Recommended yearly dental visit for hygiene and checkup ? ?Vaccinations: ?Influenza vaccine: Due-May obtain vaccine at your local pharmacy.  ?Pneumococcal vaccine: Due-May obtain vaccine at your local pharmacy.  ?Tdap vaccine: up to date ?Shingles vaccine: Due-May obtain vaccine at your local pharmacy.   ?Covid-19: Due-May obtain vaccine at your local pharmacy.  ? ?Advanced directives: No ? ?Conditions/risks identified: see problem list ? ?Next appointment: Follow up in one year for your annual wellness visit.  ? ?Preventive Care 40-64 Years, Female ?Preventive care refers to lifestyle choices and visits with your health care provider that can promote health and wellness. ?What does preventive care include? ?A yearly physical exam. This is also called an annual well check. ?Dental exams once or twice a year. ?Routine eye exams. Ask your health care provider how often you should have your eyes checked. ?Personal lifestyle choices, including: ?Daily care of your teeth and gums. ?Regular physical activity. ?Eating a healthy diet. ?Avoiding tobacco and drug use. ?Limiting alcohol use. ?Practicing safe sex. ?Taking low-dose aspirin daily starting at age 84. ?Taking vitamin and mineral supplements as recommended by your health care provider. ?What happens during an annual well check? ?The services and screenings done by your health care provider during your annual well check will depend on your age, overall health, lifestyle risk factors, and family history of  disease. ?Counseling  ?Your health care provider may ask you questions about your: ?Alcohol use. ?Tobacco use. ?Drug use. ?Emotional well-being. ?Home and relationship well-being. ?Sexual activity. ?Eating habits. ?Work and work Statistician. ?Method of birth control. ?Menstrual cycle. ?Pregnancy history. ?Screening  ?You may have the following tests or measurements: ?Height, weight, and BMI. ?Blood pressure. ?Lipid and cholesterol levels. These may be checked every 5 years, or more frequently if you are over 80 years old. ?Skin check. ?Lung cancer screening. You may have this screening every year starting at age 76 if you have a 30-pack-year history of smoking and currently smoke or have quit within the past 15 years. ?Fecal occult blood test (FOBT) of the stool. You may have this test every year starting at age 58. ?Flexible sigmoidoscopy or colonoscopy. You may have a sigmoidoscopy every 5 years or a colonoscopy every 10 years starting at age 27. ?Hepatitis C blood test. ?Hepatitis B blood test. ?Sexually transmitted disease (STD) testing. ?Diabetes screening. This is done by checking your blood sugar (glucose) after you have not eaten for a while (fasting). You may have this done every 1-3 years. ?Mammogram. This may be done every 1-2 years. Talk to your health care provider about when you should start having regular mammograms. This may depend on whether you have a family history of breast cancer. ?BRCA-related cancer screening. This may be done if you have a family history of breast, ovarian, tubal, or peritoneal cancers. ?Pelvic exam and Pap test. This may be done every 3 years starting at age 86. Starting at age 11, this may be done every 5 years if you have a Pap test in combination with an HPV test. ?Bone density scan. This is done to screen for osteoporosis. You may  have this scan if you are at high risk for osteoporosis. ?Discuss your test results, treatment options, and if necessary, the need for more  tests with your health care provider. ?Vaccines  ?Your health care provider may recommend certain vaccines, such as: ?Influenza vaccine. This is recommended every year. ?Tetanus, diphtheria, and acellular pertussis (Tdap, Td) vaccine. You may need a Td booster every 10 years. ?Zoster vaccine. You may need this after age 73. ?Pneumococcal 13-valent conjugate (PCV13) vaccine. You may need this if you have certain conditions and were not previously vaccinated. ?Pneumococcal polysaccharide (PPSV23) vaccine. You may need one or two doses if you smoke cigarettes or if you have certain conditions. ?Talk to your health care provider about which screenings and vaccines you need and how often you need them. ?This information is not intended to replace advice given to you by your health care provider. Make sure you discuss any questions you have with your health care provider. ?Document Released: 11/18/2015 Document Revised: 07/11/2016 Document Reviewed: 08/23/2015 ?Elsevier Interactive Patient Education ? 2017 [employer] ? ? ? ?Fall Prevention in the Home ?Falls can cause injuries. They can happen to people of all ages. There are many things you can do to make your home safe and to help prevent falls. ?What can I do on the outside of my home? ?Regularly fix the edges of walkways and driveways and fix any cracks. ?Remove anything that might make you trip as you walk through a door, such as a raised step or threshold. ?Trim any bushes or trees on the path to your home. ?Use bright outdoor lighting. ?Clear any walking paths of anything that might make someone trip, such as rocks or tools. ?Regularly check to see if handrails are loose or broken. Make sure that both sides of any steps have handrails. ?Any raised decks and porches should have guardrails on the edges. ?Have any leaves, snow, or ice cleared regularly. ?Use sand or salt on walking paths during winter. ?Clean up any spills in your garage right away. This includes  oil or grease spills. ?What can I do in the bathroom? ?Use night lights. ?Install grab bars by the toilet and in the tub and shower. Do not use towel bars as grab bars. ?Use non-skid mats or decals in the tub or shower. ?If you need to sit down in the shower, use a plastic, non-slip stool. ?Keep the floor dry. Clean up any water that spills on the floor as soon as it happens. ?Remove soap buildup in the tub or shower regularly. ?Attach bath mats securely with double-sided non-slip rug tape. ?Do not have throw rugs and other things on the floor that can make you trip. ?What can I do in the bedroom? ?Use night lights. ?Make sure that you have a light by your bed that is easy to reach. ?Do not use any sheets or blankets that are too big for your bed. They should not hang down onto the floor. ?Have a firm chair that has side arms. You can use this for support while you get dressed. ?Do not have throw rugs and other things on the floor that can make you trip. ?What can I do in the kitchen? ?Clean up any spills right away. ?Avoid walking on wet floors. ?Keep items that you use a lot in easy-to-reach places. ?If you need to reach something above you, use a strong step stool that has a grab bar. ?Keep electrical cords out of the way. ?Do not use floor polish  or wax that makes floors slippery. If you must use wax, use non-skid floor wax. ?Do not have throw rugs and other things on the floor that can make you trip. ?What can I do with my stairs? ?Do not leave any items on the stairs. ?Make sure that there are handrails on both sides of the stairs and use them. Fix handrails that are broken or loose. Make sure that handrails are as long as the stairways. ?Check any carpeting to make sure that it is firmly attached to the stairs. Fix any carpet that is loose or worn. ?Avoid having throw rugs at the top or bottom of the stairs. If you do have throw rugs, attach them to the floor with carpet tape. ?Make sure that you have a light  switch at the top of the stairs and the bottom of the stairs. If you do not have them, ask someone to add them for you. ?What else can I do to help prevent falls? ?Wear shoes that: ?Do not have high heels. ?Have rubbe

## 2022-08-17 ENCOUNTER — Ambulatory Visit (INDEPENDENT_AMBULATORY_CARE_PROVIDER_SITE_OTHER): Payer: Medicare Other | Admitting: Family Medicine

## 2022-08-17 ENCOUNTER — Encounter: Payer: Self-pay | Admitting: Family Medicine

## 2022-08-17 VITALS — BP 108/70 | HR 61 | Temp 97.6°F | Resp 16 | Ht 68.0 in | Wt 151.4 lb

## 2022-08-17 DIAGNOSIS — Z0000 Encounter for general adult medical examination without abnormal findings: Secondary | ICD-10-CM

## 2022-08-17 DIAGNOSIS — F418 Other specified anxiety disorders: Secondary | ICD-10-CM | POA: Diagnosis not present

## 2022-08-17 DIAGNOSIS — E2839 Other primary ovarian failure: Secondary | ICD-10-CM

## 2022-08-17 DIAGNOSIS — K91850 Pouchitis: Secondary | ICD-10-CM

## 2022-08-17 DIAGNOSIS — K51919 Ulcerative colitis, unspecified with unspecified complications: Secondary | ICD-10-CM | POA: Diagnosis not present

## 2022-08-17 DIAGNOSIS — I480 Paroxysmal atrial fibrillation: Secondary | ICD-10-CM | POA: Diagnosis not present

## 2022-08-17 DIAGNOSIS — F419 Anxiety disorder, unspecified: Secondary | ICD-10-CM

## 2022-08-17 LAB — COMPREHENSIVE METABOLIC PANEL
ALT: 17 U/L (ref 0–35)
AST: 23 U/L (ref 0–37)
Albumin: 4.5 g/dL (ref 3.5–5.2)
Alkaline Phosphatase: 71 U/L (ref 39–117)
BUN: 13 mg/dL (ref 6–23)
CO2: 31 mEq/L (ref 19–32)
Calcium: 10.1 mg/dL (ref 8.4–10.5)
Chloride: 102 mEq/L (ref 96–112)
Creatinine, Ser: 0.73 mg/dL (ref 0.40–1.20)
GFR: 89.17 mL/min (ref >60.00)
Glucose, Bld: 87 mg/dL (ref 70–99)
Potassium: 4.7 mEq/L (ref 3.5–5.1)
Sodium: 140 mEq/L (ref 135–145)
Total Bilirubin: 0.7 mg/dL (ref 0.2–1.2)
Total Protein: 7 g/dL (ref 6.0–8.3)

## 2022-08-17 LAB — LIPID PANEL
Cholesterol: 195 mg/dL (ref 0–200)
HDL: 92.6 mg/dL (ref >39.00)
LDL Cholesterol: 89 mg/dL (ref 0–99)
NonHDL: 102.86
Total CHOL/HDL Ratio: 2
Triglycerides: 70 mg/dL (ref 0.0–149.0)
VLDL: 14 mg/dL (ref 0.0–40.0)

## 2022-08-17 MED ORDER — CLONAZEPAM 1 MG PO TABS: ORAL_TABLET | ORAL | 1 refills | Status: AC

## 2022-08-17 NOTE — Assessment & Plan Note (Signed)
S/p iliostomy stable

## 2022-08-17 NOTE — Progress Notes (Signed)
Subjective:   By signing my name below, I, [name] , attest that this documentation has been prepared under the direction and in the presence of [name], 08/17/2022.    Patient ID: [name], female    DOB: September 07, 1961, 61 y.o.   MRN: [id number]  Chief Complaint  Patient presents with   Annual Exam    Pt states fasting     HPI Patient is in today for a comprehensive physical exam.  She denies new moles, itching, chills, fever, hearing loss, sinus pain, congestion, sore throat, cough and hemoptysis, chest pain, palpitations, wheezing, constipation, diarrhea, blood in stool, nausea and vomiting, dysuria, frequency, hematuria, myalgias and joint pain, depression, anxiety.  Refills: Patient is requesting refill on 1 mg Klonopin.  Klonopin- Patient is currently taking 1 mg Klonopin whenever needed.  Hot flashes- Patient complains of experiencing hot flashes and notes that 20 mg Lexapro does occasionally help.  Sleep- She reports difficulty sleeping about 4 nights in the week because she feels like she has to use the bathroom.  Mood- Patient reports to be in a stable mood. Urology- She reports to regularly see her urologist which is about once every year.  Cardiologist- She reports of  regularly seeing her cardiologist, Dr. Clovis Riley.  Family history- She reports no new changes in family history. Social history-She reports no new surgeries. Immunizations- She reports to have received her shingles and tetanus vaccine. Pap smear last completed on 04/19/2010. Mammogram last completed on 12/29/2021. Exercise- She reports that she does not participate in regular exercise, but does keep busy with yard work Vision- She is UTD on vision care. Dental- She is UTD on dental care.   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   KIDNEY STONE SURGERY N/A    DR Cordelia Pen-- [facility] [place]  urology   SHOULDER SURGERY Left    WRIST SURGERY Left     Family History  Problem Relation Age of Onset   Multiple myeloma Father    Cancer Father 33       multiple myeloma   Ovarian cancer Mother    Emphysema Mother    COPD Mother    Hypertension Mother    Obesity Mother    Depression Mother    Diabetes Mother    Cancer Mother 60       ovarian   Anxiety disorder Other    Arthritis Brother     Social History   Socioeconomic History   Marital status: Married    Spouse name: Not on file   Number of children: Not on file   Years of education: Not on file   Highest education level: Not on file  Occupational History   Occupation: not working    Employer: [employer]   Occupation: Tax inspector     Comment: on disability  Tobacco Use   Smoking status: Never   Smokeless tobacco: Never  Substance and Sexual Activity   Alcohol use: No   Drug use: No   Sexual activity: Not Currently    Partners: Male  Other Topics Concern   Not on file  Social History Narrative   Exercise--- no    Social Determinants of Health   Financial Resource Strain: Low Risk  (  01/16/2022)   Overall Financial Resource Strain (CARDIA)    Difficulty of Paying Living Expenses: Not hard at all  Food Insecurity: No Food Insecurity (01/16/2022)   Hunger Vital Sign    Worried About Running Out of Food in the Last Year: Never true    Ran Out of Food in the Last Year: Never true  Transportation Needs: No Transportation Needs (01/16/2022)   PRAPARE - Administrator, Civil Service (Medical): No    Lack of Transportation (Non-Medical): No  Physical Activity: Sufficiently Active (01/16/2022)   Exercise Vital Sign    Days of Exercise per Week: 7 days    Minutes of Exercise per Session: 60 min  Stress: No Stress Concern Present (01/16/2022)   [employer] of Occupational Health - Occupational Stress  Questionnaire    Feeling of Stress : Not at all  Social Connections: Moderately Integrated (01/16/2022)   Social Connection and Isolation Panel [NHANES]    Frequency of Communication with Friends and Family: More than three times a week    Frequency of Social Gatherings with Friends and Family: Once a week    Attends Religious Services: Never    Database administrator or Organizations: Yes    Attends Engineer, structural: More than 4 times per year    Marital Status: Married  Catering manager Violence: Not At Risk (01/16/2022)   Humiliation, Afraid, Rape, and Kick questionnaire    Fear of Current or Ex-Partner: No    Emotionally Abused: No    Physically Abused: No    Sexually Abused: No    Outpatient Medications Prior to Visit  Medication Sig Dispense Refill   apixaban (ELIQUIS) 5 MG TABS tablet Take by mouth.     escitalopram (LEXAPRO) 20 MG tablet Take 1 tablet (20 mg total) by mouth daily. 90 tablet 3   sodium bicarbonate 650 MG tablet Take 650 mg by mouth 2 (two) times daily.     metroNIDAZOLE (FLAGYL) 500 MG tablet Take by mouth.     terbinafine (LAMISIL) 250 MG tablet Take 1 tablet (250 mg total) by mouth daily. 90 tablet 0   clonazePAM (KLONOPIN) 1 MG tablet 1 po qhs prn (Patient not taking: Reported on 01/16/2022) 30 tablet 1   No facility-administered medications prior to visit.    Allergies  Allergen Reactions   Estradiol Other (See Comments)    inflamed tissue    Codeine    Hydrocodone-Acetaminophen    Ciprofloxacin Hcl Itching    Possible allergy    Review of Systems  Constitutional:  Negative for chills, fever and malaise/fatigue.       (+) hot flashes  HENT:  Negative for congestion, sinus pain and sore throat.   Eyes:  Negative for blurred vision.  Respiratory:  Negative for cough, hemoptysis, shortness of breath and wheezing.   Cardiovascular:  Negative for chest pain, palpitations and leg swelling.  Gastrointestinal:  Negative for abdominal pain,  blood in stool, constipation, diarrhea, nausea and vomiting.  Genitourinary:  Negative for dysuria, frequency and hematuria.  Musculoskeletal:  Negative for falls, joint pain and myalgias.  Skin:  Negative for itching and rash.       (-) new moles   Neurological:  Negative for dizziness, loss of consciousness and headaches.  Endo/Heme/Allergies:  Negative for environmental allergies.  Psychiatric/Behavioral:  Negative for depression. The patient is not nervous/anxious.        Objective:    Physical Exam Vitals and nursing note reviewed.  Constitutional:  General: She is not in acute distress.    Appearance: Normal appearance. She is not ill-appearing.  HENT:     Head: Normocephalic and atraumatic.     Right Ear: Tympanic membrane, ear canal and external ear normal.     Left Ear: Tympanic membrane, ear canal and external ear normal.     Nose: No congestion or rhinorrhea.  Eyes:     Extraocular Movements: Extraocular movements intact.     Pupils: Pupils are equal, round, and reactive to light.  Cardiovascular:     Rate and Rhythm: Normal rate and regular rhythm.     Heart sounds: Normal heart sounds. No murmur heard.    No gallop.  Pulmonary:     Effort: Pulmonary effort is normal. No respiratory distress.     Breath sounds: Normal breath sounds. No wheezing or rales.  Abdominal:     General: Bowel sounds are normal. There is no distension.     Palpations: Abdomen is soft.     Tenderness: There is no abdominal tenderness. There is no guarding.  Musculoskeletal:        General: Normal range of motion.     Cervical back: Normal range of motion and neck supple.  Skin:    General: Skin is warm and dry.  Neurological:     General: No focal deficit present.     Mental Status: She is alert and oriented to person, place, and time.     Gait: Gait normal.  Psychiatric:        Mood and Affect: Mood normal.        Thought Content: Thought content normal.        Judgment:  Judgment normal.     BP 108/70 (BP Location: Left Arm, Patient Position: Sitting, Cuff Size: Normal)   Pulse 61   Temp 97.6 F (36.4 C) (Oral)   Resp 16   Ht 5\' 8"  (1.727 m)   Wt 151 lb 6.4 oz (68.7 kg)   SpO2 97%   BMI 23.02 kg/m  Wt Readings from Last 3 Encounters:  08/17/22 151 lb 6.4 oz (68.7 kg)  01/16/22 147 lb (66.7 kg)  11/27/21 150 lb 3.2 oz (68.1 kg)    Diabetic Foot Exam - Simple   No data filed    Lab Results  Component Value Date   WBC 5.9 08/15/2021   HGB 13.7 08/15/2021   HCT 42.5 08/15/2021   PLT 259.0 08/15/2021   GLUCOSE 87 08/15/2021   CHOL 189 08/15/2021   TRIG 72.0 08/15/2021   HDL 91.00 08/15/2021   LDLCALC 83 08/15/2021   ALT 17 08/15/2021   AST 24 08/15/2021   NA 140 08/15/2021   K 4.2 08/15/2021   CL 102 08/15/2021   CREATININE 0.66 08/15/2021   BUN 13 08/15/2021   CO2 30 08/15/2021   TSH 1.60 12/04/2011   HGBA1C 5.5 10/13/2010    Lab Results  Component Value Date   TSH 1.60 12/04/2011   Lab Results  Component Value Date   WBC 5.9 08/15/2021   HGB 13.7 08/15/2021   HCT 42.5 08/15/2021   MCV 91.3 08/15/2021   PLT 259.0 08/15/2021   Lab Results  Component Value Date   NA 140 08/15/2021   K 4.2 08/15/2021   CO2 30 08/15/2021   GLUCOSE 87 08/15/2021   BUN 13 08/15/2021   CREATININE 0.66 08/15/2021   BILITOT 0.7 08/15/2021   ALKPHOS 72 08/15/2021   AST 24 08/15/2021   ALT 17 08/15/2021  PROT 7.0 08/15/2021   ALBUMIN 4.6 08/15/2021   CALCIUM 10.1 08/15/2021   GFR 95.79 08/15/2021   Lab Results  Component Value Date   CHOL 189 08/15/2021   Lab Results  Component Value Date   HDL 91.00 08/15/2021   Lab Results  Component Value Date   LDLCALC 83 08/15/2021   Lab Results  Component Value Date   TRIG 72.0 08/15/2021   Lab Results  Component Value Date   CHOLHDL 2 08/15/2021   Lab Results  Component Value Date   HGBA1C 5.5 10/13/2010       Assessment & Plan:   Problem List Items Addressed This Visit        Unprioritized   Pouchitis (HCC)   Ulcerative colitis (HCC)    S/p iliostomy stable      Preventative health care - Primary    ghm utd Check labs  See avs       Paroxysmal atrial fibrillation [facility])    Per cardiology Cont'  eliquis       Relevant Orders   CBC with Differential/Platelet   Comprehensive metabolic panel   Lipid panel   Depression with anxiety    Stable con't lexapro       Other Visit Diagnoses     Anxiety       Relevant Medications   clonazePAM (KLONOPIN) 1 MG tablet   Estrogen deficiency       Relevant Orders   DG Bone Density        Meds ordered this encounter  Medications   clonazePAM (KLONOPIN) 1 MG tablet    Sig: 1 po qhs prn    Dispense:  30 tablet    Refill:  1    I, [name], personally preformed the services described in this documentation.  All medical record entries made by the scribe were at my direction and in my presence.  I have reviewed the chart and discharge instructions (if applicable) and agree that the record reflects my personal performance and is accurate and complete. 08/17/2022.  I,Bronco Mcgrory R Lowne Chase,acting as a scribe for [employer], DO.,have documented all relevant documentation on the behalf of Donato Schultz, DO,as directed by  Donato Schultz, DO while in the presence of Donato Schultz, DO.      Donato Schultz, DO

## 2022-08-17 NOTE — Assessment & Plan Note (Signed)
Stable con't lexapro   

## 2022-08-17 NOTE — Patient Instructions (Signed)
Preventive Care 60-60 Years Old, Female Preventive care refers to lifestyle choices and visits with your health care provider that can promote health and wellness. Preventive care visits are also called wellness exams. What can I expect for my preventive care visit? Counseling Your health care provider may ask you questions about your: Medical history, including: Past medical problems. Family medical history. Pregnancy history. Current health, including: Menstrual cycle. Method of birth control. Emotional well-being. Home life and relationship well-being. Sexual activity and sexual health. Lifestyle, including: Alcohol, nicotine or tobacco, and drug use. Access to firearms. Diet, exercise, and sleep habits. Work and work environment. Sunscreen use. Safety issues such as seatbelt and bike helmet use. Physical exam Your health care provider will check your: Height and weight. These may be used to calculate your BMI (body mass index). BMI is a measurement that tells if you are at a healthy weight. Waist circumference. This measures the distance around your waistline. This measurement also tells if you are at a healthy weight and may help predict your risk of certain diseases, such as type 2 diabetes and high blood pressure. Heart rate and blood pressure. Body temperature. Skin for abnormal spots. What immunizations do I need?  Vaccines are usually given at various ages, according to a schedule. Your health care provider will recommend vaccines for you based on your age, medical history, and lifestyle or other factors, such as travel or where you work. What tests do I need? Screening Your health care provider may recommend screening tests for certain conditions. This may include: Lipid and cholesterol levels. Diabetes screening. This is done by checking your blood sugar (glucose) after you have not eaten for a while (fasting). Pelvic exam and Pap test. Hepatitis B test. Hepatitis C  test. HIV (human immunodeficiency virus) test. STI (sexually transmitted infection) testing, if you are at risk. Lung cancer screening. Colorectal cancer screening. Mammogram. Talk with your health care provider about when you should start having regular mammograms. This may depend on whether you have a family history of breast cancer. BRCA-related cancer screening. This may be done if you have a family history of breast, ovarian, tubal, or peritoneal cancers. Bone density scan. This is done to screen for osteoporosis. Talk with your health care provider about your test results, treatment options, and if necessary, the need for more tests. Follow these instructions at home: Eating and drinking  Eat a diet that includes fresh fruits and vegetables, whole grains, lean protein, and low-fat dairy products. Take vitamin and mineral supplements as recommended by your health care provider. Do not drink alcohol if: Your health care provider tells you not to drink. You are pregnant, may be pregnant, or are planning to become pregnant. If you drink alcohol: Limit how much you have to 0-1 drink a day. Know how much alcohol is in your drink. In the [place] one drink equals one 12 oz bottle of beer (355 mL), one 5 oz glass of wine (148 mL), or one 1 oz glass of hard liquor (44 mL). Lifestyle Brush your teeth every morning and night with fluoride toothpaste. Floss one time each day. Exercise for at least 30 minutes 5 or more days each week. Do not use any products that contain nicotine or tobacco. These products include cigarettes, chewing tobacco, and vaping devices, such as e-cigarettes. If you need help quitting, ask your health care provider. Do not use drugs. If you are sexually active, practice safe sex. Use a condom or other form of protection to   prevent STIs. If you do not wish to become pregnant, use a form of birth control. If you plan to become pregnant, see your health care provider for a  prepregnancy visit. Take aspirin only as told by your health care provider. Make sure that you understand how much to take and what form to take. Work with your health care provider to find out whether it is safe and beneficial for you to take aspirin daily. Find healthy ways to manage stress, such as: Meditation, yoga, or listening to music. Journaling. Talking to a trusted person. Spending time with friends and family. Minimize exposure to UV radiation to reduce your risk of skin cancer. Safety Always wear your seat belt while driving or riding in a vehicle. Do not drive: If you have been drinking alcohol. Do not ride with someone who has been drinking. When you are tired or distracted. While texting. If you have been using any mind-altering substances or drugs. Wear a helmet and other protective equipment during sports activities. If you have firearms in your house, make sure you follow all gun safety procedures. Seek help if you have been physically or sexually abused. What's next? Visit your health care provider once a year for an annual wellness visit. Ask your health care provider how often you should have your eyes and teeth checked. Stay up to date on all vaccines. This information is not intended to replace advice given to you by your health care provider. Make sure you discuss any questions you have with your health care provider. Document Revised: 04/19/2021 Document Reviewed: 04/19/2021 Elsevier Patient Education  [place].

## 2022-08-17 NOTE — Assessment & Plan Note (Signed)
Per cardiology Cont'  eliquis

## 2022-08-17 NOTE — Assessment & Plan Note (Signed)
ghm utd Check labs  See avs  

## 2022-08-18 DIAGNOSIS — S80811A Abrasion, right lower leg, initial encounter: Secondary | ICD-10-CM | POA: Diagnosis not present

## 2022-08-18 DIAGNOSIS — S20222A Contusion of left back wall of thorax, initial encounter: Secondary | ICD-10-CM | POA: Diagnosis not present

## 2022-08-18 DIAGNOSIS — J329 Chronic sinusitis, unspecified: Secondary | ICD-10-CM | POA: Diagnosis not present

## 2022-08-18 DIAGNOSIS — S199XXA Unspecified injury of neck, initial encounter: Secondary | ICD-10-CM | POA: Diagnosis not present

## 2022-08-18 DIAGNOSIS — S098XXA Other specified injuries of head, initial encounter: Secondary | ICD-10-CM | POA: Diagnosis not present

## 2022-08-18 DIAGNOSIS — S299XXA Unspecified injury of thorax, initial encounter: Secondary | ICD-10-CM | POA: Diagnosis not present

## 2022-08-18 DIAGNOSIS — S60222A Contusion of left hand, initial encounter: Secondary | ICD-10-CM | POA: Diagnosis not present

## 2022-08-18 DIAGNOSIS — S0990XA Unspecified injury of head, initial encounter: Secondary | ICD-10-CM | POA: Diagnosis not present

## 2022-08-18 DIAGNOSIS — R6889 Other general symptoms and signs: Secondary | ICD-10-CM | POA: Diagnosis not present

## 2022-08-18 DIAGNOSIS — S3993XA Unspecified injury of pelvis, initial encounter: Secondary | ICD-10-CM | POA: Diagnosis not present

## 2022-08-18 DIAGNOSIS — Z743 Need for continuous supervision: Secondary | ICD-10-CM | POA: Diagnosis not present

## 2022-08-18 DIAGNOSIS — S80211A Abrasion, right knee, initial encounter: Secondary | ICD-10-CM | POA: Diagnosis not present

## 2022-08-18 DIAGNOSIS — S3991XA Unspecified injury of abdomen, initial encounter: Secondary | ICD-10-CM | POA: Diagnosis not present

## 2022-08-18 DIAGNOSIS — Z23 Encounter for immunization: Secondary | ICD-10-CM | POA: Diagnosis not present

## 2022-08-18 DIAGNOSIS — S80812A Abrasion, left lower leg, initial encounter: Secondary | ICD-10-CM | POA: Diagnosis not present

## 2022-08-18 DIAGNOSIS — S63651A Sprain of metacarpophalangeal joint of left index finger, initial encounter: Secondary | ICD-10-CM | POA: Diagnosis not present

## 2022-08-18 DIAGNOSIS — S3992XA Unspecified injury of lower back, initial encounter: Secondary | ICD-10-CM | POA: Diagnosis not present

## 2022-08-18 DIAGNOSIS — I4891 Unspecified atrial fibrillation: Secondary | ICD-10-CM | POA: Diagnosis not present

## 2022-08-18 DIAGNOSIS — I499 Cardiac arrhythmia, unspecified: Secondary | ICD-10-CM | POA: Diagnosis not present

## 2022-08-18 DIAGNOSIS — R001 Bradycardia, unspecified: Secondary | ICD-10-CM | POA: Diagnosis not present

## 2022-08-18 DIAGNOSIS — Z7901 Long term (current) use of anticoagulants: Secondary | ICD-10-CM | POA: Diagnosis not present

## 2022-08-18 DIAGNOSIS — S60022A Contusion of left index finger without damage to nail, initial encounter: Secondary | ICD-10-CM | POA: Diagnosis not present

## 2022-08-18 DIAGNOSIS — R110 Nausea: Secondary | ICD-10-CM | POA: Diagnosis not present

## 2022-08-20 LAB — CBC WITH DIFFERENTIAL/PLATELET
Basophils Absolute: 0.1 10*3/uL (ref 0.0–0.1)
Basophils Relative: 1 % (ref 0.0–3.0)
Eosinophils Absolute: 0.2 10*3/uL (ref 0.0–0.7)
Eosinophils Relative: 2.8 % (ref 0.0–5.0)
HCT: 43.7 % (ref 36.0–46.0)
Hemoglobin: 14 g/dL (ref 12.0–15.0)
Lymphocytes Relative: 26.4 % (ref 12.0–46.0)
Lymphs Abs: 1.9 10*3/uL (ref 0.7–4.0)
MCHC: 32 g/dL (ref 30.0–36.0)
MCV: 93 fl (ref 78.0–100.0)
Monocytes Absolute: 0.6 10*3/uL (ref 0.1–1.0)
Monocytes Relative: 7.7 % (ref 3.0–12.0)
Neutro Abs: 4.4 10*3/uL (ref 1.4–7.7)
Neutrophils Relative %: 61.8 % (ref 43.0–77.0)
Platelets: 289 10*3/uL (ref 150.0–400.0)
RBC: 4.7 Mil/uL (ref 3.87–5.11)
RDW: 13.5 % (ref 11.5–15.5)
WBC: 7.2 10*3/uL (ref 4.0–10.5)

## 2022-09-10 ENCOUNTER — Ambulatory Visit (HOSPITAL_BASED_OUTPATIENT_CLINIC_OR_DEPARTMENT_OTHER)
Admission: RE | Admit: 2022-09-10 | Discharge: 2022-09-10 | Disposition: A | Discharge status: Home / Self Care | Payer: Medicare Other | Source: Ambulatory Visit | Attending: Family Medicine | Admitting: Family Medicine

## 2022-09-10 DIAGNOSIS — E2839 Other primary ovarian failure: Secondary | ICD-10-CM | POA: Insufficient documentation

## 2022-09-10 DIAGNOSIS — M85851 Other specified disorders of bone density and structure, right thigh: Secondary | ICD-10-CM | POA: Diagnosis not present

## 2022-09-14 DIAGNOSIS — I480 Paroxysmal atrial fibrillation: Secondary | ICD-10-CM | POA: Diagnosis not present

## 2022-12-05 ENCOUNTER — Telehealth: Payer: Self-pay | Admitting: Family Medicine

## 2022-12-05 NOTE — Telephone Encounter (Signed)
Labs faxed to both numbers

## 2022-12-05 NOTE — Telephone Encounter (Signed)
[name] from [facility] stated they were requesting pt's most recent labs faxed to [phone] or alt. [phone].

## 2023-01-08 ENCOUNTER — Telehealth: Payer: Self-pay | Admitting: Family Medicine

## 2023-01-08 DIAGNOSIS — N200 Calculus of kidney: Secondary | ICD-10-CM | POA: Diagnosis not present

## 2023-01-08 DIAGNOSIS — R3129 Other microscopic hematuria: Secondary | ICD-10-CM | POA: Diagnosis not present

## 2023-01-08 NOTE — Telephone Encounter (Signed)
Contacted Gerardo C Hudgins to schedule their annual wellness visit. Appointment made for 01/18/2023.  Sherol Dade; [facility] Direct Dial: [phone]

## 2023-01-11 DIAGNOSIS — Z1231 Encounter for screening mammogram for malignant neoplasm of breast: Secondary | ICD-10-CM | POA: Diagnosis not present

## 2023-01-15 ENCOUNTER — Encounter: Payer: Self-pay | Admitting: Family Medicine

## 2023-01-28 DIAGNOSIS — J310 Chronic rhinitis: Secondary | ICD-10-CM | POA: Diagnosis not present

## 2023-01-30 ENCOUNTER — Ambulatory Visit (INDEPENDENT_AMBULATORY_CARE_PROVIDER_SITE_OTHER): Payer: Medicare Other | Admitting: *Deleted

## 2023-01-30 DIAGNOSIS — Z0000 Encounter for general adult medical examination without abnormal findings: Secondary | ICD-10-CM | POA: Diagnosis not present

## 2023-01-30 NOTE — Patient Instructions (Signed)
[name] , Thank you for taking time to come for your Medicare Wellness Visit. I appreciate your ongoing commitment to your health goals. Please review the following plan we discussed and let me know if I can assist you in the future.   These are the goals we discussed:  Goals   None     This is a list of the screening recommended for you and due dates:  Health Maintenance  Topic Date Due   COVID-19 Vaccine (1) Never done   Pap Smear  10/24/2021   Flu Shot  02/03/2023*   HIV Screening  08/18/2023*   Medicare Annual Wellness Visit  01/30/2024   Mammogram  01/10/2025   DTaP/Tdap/Td vaccine (3 - Td or Tdap) 08/18/2032   Hepatitis C Screening: USPSTF Recommendation to screen - Ages 66-79 yo.  Completed   Zoster (Shingles) Vaccine  Completed   HPV Vaccine  Aged Out   Colon Cancer Screening  Discontinued  *Topic was postponed. The date shown is not the original due date.      Next appointment: Follow up in one year for your annual wellness visit.   Preventive Care 40-64 Years, Female Preventive care refers to lifestyle choices and visits with your health care provider that can promote health and wellness. What does preventive care include? A yearly physical exam. This is also called an annual well check. Dental exams once or twice a year. Routine eye exams. Ask your health care provider how often you should have your eyes checked. Personal lifestyle choices, including: Daily care of your teeth and gums. Regular physical activity. Eating a healthy diet. Avoiding tobacco and drug use. Limiting alcohol use. Practicing safe sex. Taking low-dose aspirin daily starting at age 47. Taking vitamin and mineral supplements as recommended by your health care provider. What happens during an annual well check? The services and screenings done by your health care provider during your annual well check will depend on your age, overall health, lifestyle risk factors, and family history of  disease. Counseling  Your health care provider may ask you questions about your: Alcohol use. Tobacco use. Drug use. Emotional well-being. Home and relationship well-being. Sexual activity. Eating habits. Work and work Statistician. Method of birth control. Menstrual cycle. Pregnancy history. Screening  You may have the following tests or measurements: Height, weight, and BMI. Blood pressure. Lipid and cholesterol levels. These may be checked every 5 years, or more frequently if you are over 51 years old. Skin check. Lung cancer screening. You may have this screening every year starting at age 52 if you have a 30-pack-year history of smoking and currently smoke or have quit within the past 15 years. Fecal occult blood test (FOBT) of the stool. You may have this test every year starting at age 87. Flexible sigmoidoscopy or colonoscopy. You may have a sigmoidoscopy every 5 years or a colonoscopy every 10 years starting at age 34. Hepatitis C blood test. Hepatitis B blood test. Sexually transmitted disease (STD) testing. Diabetes screening. This is done by checking your blood sugar (glucose) after you have not eaten for a while (fasting). You may have this done every 1-3 years. Mammogram. This may be done every 1-2 years. Talk to your health care provider about when you should start having regular mammograms. This may depend on whether you have a family history of breast cancer. BRCA-related cancer screening. This may be done if you have a family history of breast, ovarian, tubal, or peritoneal cancers. Pelvic exam and Pap  test. This may be done every 3 years starting at age 38. Starting at age 3, this may be done every 5 years if you have a Pap test in combination with an HPV test. Bone density scan. This is done to screen for osteoporosis. You may have this scan if you are at high risk for osteoporosis. Discuss your test results, treatment options, and if necessary, the need for more  tests with your health care provider. Vaccines  Your health care provider may recommend certain vaccines, such as: Influenza vaccine. This is recommended every year. Tetanus, diphtheria, and acellular pertussis (Tdap, Td) vaccine. You may need a Td booster every 10 years. Zoster vaccine. You may need this after age 61. Pneumococcal 13-valent conjugate (PCV13) vaccine. You may need this if you have certain conditions and were not previously vaccinated. Pneumococcal polysaccharide (PPSV23) vaccine. You may need one or two doses if you smoke cigarettes or if you have certain conditions. Talk to your health care provider about which screenings and vaccines you need and how often you need them. This information is not intended to replace advice given to you by your health care provider. Make sure you discuss any questions you have with your health care provider. Document Released: 11/18/2015 Document Revised: 07/11/2016 Document Reviewed: 08/23/2015 Elsevier Interactive Patient Education  2017 [place] Prevention in the Home Falls can cause injuries. They can happen to people of all ages. There are many things you can do to make your home safe and to help prevent falls. What can I do on the outside of my home? Regularly fix the edges of walkways and driveways and fix any cracks. Remove anything that might make you trip as you walk through a door, such as a raised step or threshold. Trim any bushes or trees on the path to your home. Use bright outdoor lighting. Clear any walking paths of anything that might make someone trip, such as rocks or tools. Regularly check to see if handrails are loose or broken. Make sure that both sides of any steps have handrails. Any raised decks and porches should have guardrails on the edges. Have any leaves, snow, or ice cleared regularly. Use sand or salt on walking paths during winter. Clean up any spills in your garage right away. This includes  oil or grease spills. What can I do in the bathroom? Use night lights. Install grab bars by the toilet and in the tub and shower. Do not use towel bars as grab bars. Use non-skid mats or decals in the tub or shower. If you need to sit down in the shower, use a plastic, non-slip stool. Keep the floor dry. Clean up any water that spills on the floor as soon as it happens. Remove soap buildup in the tub or shower regularly. Attach bath mats securely with double-sided non-slip rug tape. Do not have throw rugs and other things on the floor that can make you trip. What can I do in the bedroom? Use night lights. Make sure that you have a light by your bed that is easy to reach. Do not use any sheets or blankets that are too big for your bed. They should not hang down onto the floor. Have a firm chair that has side arms. You can use this for support while you get dressed. Do not have throw rugs and other things on the floor that can make you trip. What can I do in the kitchen? Clean up any spills  right away. Avoid walking on wet floors. Keep items that you use a lot in easy-to-reach places. If you need to reach something above you, use a strong step stool that has a grab bar. Keep electrical cords out of the way. Do not use floor polish or wax that makes floors slippery. If you must use wax, use non-skid floor wax. Do not have throw rugs and other things on the floor that can make you trip. What can I do with my stairs? Do not leave any items on the stairs. Make sure that there are handrails on both sides of the stairs and use them. Fix handrails that are broken or loose. Make sure that handrails are as long as the stairways. Check any carpeting to make sure that it is firmly attached to the stairs. Fix any carpet that is loose or worn. Avoid having throw rugs at the top or bottom of the stairs. If you do have throw rugs, attach them to the floor with carpet tape. Make sure that you have a light  switch at the top of the stairs and the bottom of the stairs. If you do not have them, ask someone to add them for you. What else can I do to help prevent falls? Wear shoes that: Do not have high heels. Have rubber bottoms. Are comfortable and fit you well. Are closed at the toe. Do not wear sandals. If you use a stepladder: Make sure that it is fully opened. Do not climb a closed stepladder. Make sure that both sides of the stepladder are locked into place. Ask someone to hold it for you, if possible. Clearly mark and make sure that you can see: Any grab bars or handrails. First and last steps. Where the edge of each step is. Use tools that help you move around (mobility aids) if they are needed. These include: Canes. Walkers. Scooters. Crutches. Turn on the lights when you go into a dark area. Replace any light bulbs as soon as they burn out. Set up your furniture so you have a clear path. Avoid moving your furniture around. If any of your floors are uneven, fix them. If there are any pets around you, be aware of where they are. Review your medicines with your doctor. Some medicines can make you feel dizzy. This can increase your chance of falling. Ask your doctor what other things that you can do to help prevent falls. This information is not intended to replace advice given to you by your health care provider. Make sure you discuss any questions you have with your health care provider. Document Released: 08/18/2009 Document Revised: 03/29/2016 Document Reviewed: 11/26/2014 Elsevier Interactive Patient Education  2017 [employer].

## 2023-01-30 NOTE — Progress Notes (Signed)
Subjective:  Pt completed ADLs, Fall risk, and SDOH during e-check in on 01/29/23. Answers verified with pt.    [name] is a 62 y.o. female who presents for Medicare Annual (Subsequent) preventive examination.  I connected with  [name] on 01/30/23 by a audio enabled telemedicine application and verified that I am speaking with the correct person using two identifiers.  Patient Location: Home  Provider Location: Office/Clinic  I discussed the limitations of evaluation and management by telemedicine. The patient expressed understanding and agreed to proceed.   Review of Systems     Cardiac Risk Factors include: none     Objective:    There were no vitals filed for this visit. There is no height or weight on file to calculate BMI.     01/30/2023    9:40 AM 01/16/2022    1:11 PM  Advanced Directives  Does Patient Have a Medical Advance Directive? No No  Would patient like information on creating a medical advance directive? No - Patient declined No - Patient declined    Current Medications (verified) Outpatient Encounter Medications as of 01/30/2023  Medication Sig   apixaban (ELIQUIS) 5 MG TABS tablet Take by mouth.   clonazePAM (KLONOPIN) 1 MG tablet 1 po qhs prn   escitalopram (LEXAPRO) 20 MG tablet Take 1 tablet (20 mg total) by mouth daily.   sodium bicarbonate 650 MG tablet Take 650 mg by mouth 2 (two) times daily.   No facility-administered encounter medications on file as of 01/30/2023.    Allergies (verified) Estradiol, Codeine, Hydrocodone-acetaminophen, and Ciprofloxacin hcl   History: Past Medical History:  Diagnosis Date   A-fib [facility])    Allergy    Anxiety    Arthritis    Cataract    Depression    Past Surgical History:  Procedure Laterality Date   BRAIN SURGERY  06/2005   Augmentation    COLON SURGERY  1998   Removal   COSMETIC SURGERY  2006   FRACTURE SURGERY  2010   iliostemy  03/2007   KIDNEY STONE SURGERY N/A    DR Ottie Glazier-- [facility]  urology   SHOULDER SURGERY Left    WRIST SURGERY Left    Family History  Problem Relation Age of Onset   Multiple myeloma Father    Cancer Father 87       multiple myeloma   Ovarian cancer Mother    Emphysema Mother    COPD Mother    Hypertension Mother    Obesity Mother    Depression Mother    Diabetes Mother    Cancer Mother 39       ovarian   Anxiety disorder Other    Arthritis Brother    Social History   Socioeconomic History   Marital status: Married    Spouse name: Not on file   Number of children: Not on file   Years of education: Not on file   Highest education level: Not on file  Occupational History   Occupation: not working    Employer: [employer]   Occupation: Arboriculturist     Comment: on disability  Tobacco Use   Smoking status: Never   Smokeless tobacco: Never  Substance and Sexual Activity   Alcohol use: No   Drug use: No   Sexual activity: Not Currently    Partners: Male  Other Topics Concern   Not on file  Social History Narrative   Exercise--- no    Social Determinants  of Health   Financial Resource Strain: Low Risk  (01/29/2023)   Overall Financial Resource Strain (CARDIA)    Difficulty of Paying Living Expenses: Not hard at all  Food Insecurity: No Food Insecurity (01/29/2023)   Hunger Vital Sign    Worried About Running Out of Food in the Last Year: Never true    Ran Out of Food in the Last Year: Never true  Transportation Needs: No Transportation Needs (01/29/2023)   PRAPARE - Hydrologist (Medical): No    Lack of Transportation (Non-Medical): No  Physical Activity: Insufficiently Active (01/29/2023)   Exercise Vital Sign    Days of Exercise per Week: 2 days    Minutes of Exercise per Session: 30 min  Stress: No Stress Concern Present (01/29/2023)   [place]    Feeling of Stress : Only a little  Social Connections:  Moderately Isolated (01/29/2023)   Social Connection and Isolation Panel [NHANES]    Frequency of Communication with Friends and Family: Three times a week    Frequency of Social Gatherings with Friends and Family: Once a week    Attends Religious Services: Never    Marine scientist or Organizations: No    Attends Music therapist: Patient declined    Marital Status: Married    Tobacco Counseling Counseling given: Not Answered   Clinical Intake:  Pre-visit preparation completed: Yes  Pain : No/denies pain  Diabetes: No  How often do you need to have someone help you when you read instructions, pamphlets, or other written materials from your doctor or pharmacy?: 1 - Never   Activities of Daily Living    01/29/2023    2:55 PM 01/11/2023    9:31 AM  In your present state of health, do you have any difficulty performing the following activities:  Hearing? 0 0  Vision? 0 0  Difficulty concentrating or making decisions? 0 0  Walking or climbing stairs? 0 0  Dressing or bathing? 0 0  Doing errands, shopping? 0 0  Preparing Food and eating ? N N  Using the Toilet? N N  In the past six months, have you accidently leaked urine? N N  Do you have problems with loss of bowel control? N N  Managing your Medications? N N  Managing your Finances? N N  Housekeeping or managing your Housekeeping? N N    Patient Care Team: Carollee Herter, Alferd Apa, DO as PCP - General Lovell Sheehan, NP as Nurse Practitioner (Obstetrics and Gynecology) Bonney Aid, MD as Referring Physician (Cardiology) Neil Crouch, MD (Urology)  Indicate any recent Medical Services you may have received from other than [facility] providers in the past year (date may be approximate).     Assessment:   This is a routine wellness examination for [name].  Hearing/Vision screen No results found.  Dietary issues and exercise activities discussed: Current Exercise Habits: Home exercise routine,  Type of exercise: walking, Time (Minutes): 45, Frequency (Times/Week): 3, Weekly Exercise (Minutes/Week): 135, Intensity: Mild, Exercise limited by: None identified   Goals Addressed   None    Depression Screen    01/30/2023   10:03 AM 01/16/2022    1:10 PM 08/15/2021    9:47 AM  PHQ 2/9 Scores  PHQ - 2 Score 0 0 0    Fall Risk    01/29/2023    2:55 PM 01/11/2023    9:31 AM 01/16/2022  1:10 PM 08/15/2021    9:47 AM  Fall Risk   Falls in the past year? 1 1 0 0  Number falls in past yr: 0 0 0 0  Injury with Fall? 0 0 0 0  Risk for fall due to : History of fall(s)  No Fall Risks   Follow up Falls evaluation completed  Falls evaluation completed Falls evaluation completed    Roe:  Any stairs in or around the home? Yes  If so, are there any without handrails? No  Home free of loose throw rugs in walkways, pet beds, electrical cords, etc? Yes  Adequate lighting in your home to reduce risk of falls? Yes   ASSISTIVE DEVICES UTILIZED TO PREVENT FALLS:  Life alert? No  Use of a cane, walker or w/c? No  Grab bars in the bathroom? No  Shower chair or bench in shower? No  Elevated toilet seat or a handicapped toilet? No   TIMED UP AND GO:  Was the test performed?  No, audio visit .    Cognitive Function:        01/30/2023    9:50 AM 01/16/2022    1:15 PM  6CIT Screen  What Year? 0 points 0 points  What month? 0 points 0 points  What time? 0 points 0 points  Count back from 20 0 points 0 points  Months in reverse 0 points 0 points  Repeat phrase 0 points 0 points  Total Score 0 points 0 points    Immunizations Immunization History  Administered Date(s) Administered   Influenza Split 08/22/2013   Td 12/16/2002   Tdap 08/18/2022   Zoster Recombinat (Shingrix) 08/17/2021, 01/16/2022    TDAP status: Up to date  Flu Vaccine status: Declined, Education has been provided regarding the importance of this vaccine but patient  still declined. Advised may receive this vaccine at local pharmacy or Health Dept. Aware to provide a copy of the vaccination record if obtained from local pharmacy or Health Dept. Verbalized acceptance and understanding.  Pneumococcal vaccine status: Due, Education has been provided regarding the importance of this vaccine. Advised may receive this vaccine at local pharmacy or Health Dept. Aware to provide a copy of the vaccination record if obtained from local pharmacy or Health Dept. Verbalized acceptance and understanding.  Covid-19 vaccine status: Information provided on how to obtain vaccines.   Qualifies for Shingles Vaccine? Yes   Zostavax completed No   Shingrix Completed?: No.    Education has been provided regarding the importance of this vaccine. Patient has been advised to call insurance company to determine out of pocket expense if they have not yet received this vaccine. Advised may also receive vaccine at local pharmacy or Health Dept. Verbalized acceptance and understanding.  Screening Tests Health Maintenance  Topic Date Due   COVID-19 Vaccine (1) Never done   PAP SMEAR-Modifier  10/24/2021   Medicare Annual Wellness (AWV)  01/17/2023   INFLUENZA VACCINE  02/03/2023 (Originally 06/05/2022)   HIV Screening  08/18/2023 (Originally 11/10/1975)   MAMMOGRAM  01/10/2025   DTaP/Tdap/Td (3 - Td or Tdap) 08/18/2032   Hepatitis C Screening  Completed   Zoster Vaccines- Shingrix  Completed   HPV VACCINES  Aged Out   COLONOSCOPY (Pts 45-95yrs Insurance coverage will need to be confirmed)  Discontinued    Health Maintenance  Health Maintenance Due  Topic Date Due   COVID-19 Vaccine (1) Never done   PAP SMEAR-Modifier  10/24/2021  Medicare Annual Wellness (AWV)  01/17/2023    Colorectal cancer screening: No longer required.   Mammogram status: Completed 01/11/23. Repeat every year   Lung Cancer Screening: (Low Dose CT Chest recommended if Age 22-80 years, 30 pack-year currently  smoking OR have quit w/in 15years.) does not qualify.   Additional Screening:  Hepatitis C Screening: does qualify; Completed 08/15/21  Vision Screening: Recommended annual ophthalmology exams for early detection of glaucoma and other disorders of the eye. Is the patient up to date with their annual eye exam?  Yes  Who is the provider or what is the name of the office in which the patient attends annual eye exams? [facility] If pt is not established with a provider, would they like to be referred to a provider to establish care? No .   Dental Screening: Recommended annual dental exams for proper oral hygiene  Community Resource Referral / Chronic Care Management: CRR required this visit?  No   CCM required this visit?  No      Plan:     I have personally reviewed and noted the following in the patient's chart:   Medical and social history Use of alcohol, tobacco or illicit drugs  Current medications and supplements including opioid prescriptions. Patient is not currently taking opioid prescriptions. Functional ability and status Nutritional status Physical activity Advanced directives List of other physicians Hospitalizations, surgeries, and ER visits in previous 12 months Vitals Screenings to include cognitive, depression, and falls Referrals and appointments  In addition, I have reviewed and discussed with patient certain preventive protocols, quality metrics, and best practice recommendations. A written personalized care plan for preventive services as well as general preventive health recommendations were provided to patient.   Due to this being a telephonic visit, the after visit summary with patients personalized plan was offered to patient via mail or my-chart. Patient would like to access on my-chart.  Beatris Ship, [place]   01/30/2023   Nurse Notes: None

## 2023-02-19 ENCOUNTER — Telehealth: Payer: Self-pay | Admitting: Family Medicine

## 2023-02-19 DIAGNOSIS — F419 Anxiety disorder, unspecified: Secondary | ICD-10-CM

## 2023-02-19 MED ORDER — ESCITALOPRAM OXALATE 20 MG PO TABS: 20.0000 mg | ORAL_TABLET | Freq: Every day | ORAL | 1 refills | Status: DC

## 2023-02-19 NOTE — Telephone Encounter (Signed)
Rx sent 

## 2023-02-19 NOTE — Addendum Note (Signed)
Addended byConrad Maricao D on: 02/19/2023 09:55 AM   Modules accepted: Orders

## 2023-02-19 NOTE — Telephone Encounter (Signed)
Prescription Request  02/19/2023  Is this a "Controlled Substance" medicine? Yes  LOV: Visit date not found  What is the name of the medication or equipment?   escitalopram (LEXAPRO) 20 MG tablet [[id number]]   Have you contacted your pharmacy to request a refill? Yes   Which pharmacy would you like this sent to?   [id number] [place], [place] - [address] [place] [address] Phone: [phone] Fax: [phone]  Patient notified that their request is being sent to the clinical staff for review and that they should receive a response within 2 business days.   Please advise at Mobile [phone] (mobile)

## 2023-04-26 ENCOUNTER — Telehealth: Payer: Self-pay

## 2023-04-26 NOTE — Telephone Encounter (Signed)
FYI. Pt has appt on Monday

## 2023-04-26 NOTE — Telephone Encounter (Signed)
Initial Comment Caller states she has an appt Monday. She has pain in both of her legs and knees, tingling, numbness, and burning in her feet. The pain was just from the knee down, but now it is radiating up to her thigh and having some weakness in her legs. Translation No Nurse Assessment Nurse: Maisie Fus, RN, Thea Silversmith Date/Time Lamount Cohen Time): 04/26/2023 12:02:25 PM Confirm and document reason for call. If symptomatic, describe symptoms. ---Caller states that she has an appt for Monday for numbness and tingling in her legs. Worse in right leg. She has also been having some weakness in both legs recently. Does the patient have any new or worsening symptoms? ---Yes Will a triage be completed? ---Yes Related visit to physician within the last 2 weeks? ---No Does the PT have any chronic conditions? (i.e. diabetes, asthma, this includes High risk factors for pregnancy, etc.) ---Yes List chronic conditions. ---UC with colectomy, arthritis Is this a behavioral health or substance abuse call? ---No Guidelines Guideline Title Affirmed Question Affirmed Notes Nurse Date/Time (Eastern Time) Neurologic Deficit [1] Weakness of arm / hand, or leg / foot AND [2] is a chronic symptom (recurrent or ongoing AND present > 4 weeks) Imagene Gurney 04/26/2023 12:04:59 PM PLEASE NOTE: All timestamps contained within this report are represented as [place] Standard Time. CONFIDENTIALTY NOTICE: This fax transmission is intended only for the addressee. It contains information that is legally privileged, confidential or otherwise protected from use or disclosure. If you are not the intended recipient, you are strictly prohibited from reviewing, disclosing, copying using or disseminating any of this information or taking any action in reliance on or regarding this information. If you have received this fax in error, please notify us immediately by telephone so that we can arrange for its return to  [place]. Phone: [phone], Toll-Free: [phone], Fax: [phone] Page: 2 of 2 Call Id: [id number] Disp. Time Lamount Cohen Time) Disposition Final User 04/26/2023 12:13:17 PM SEE PCP WITHIN 3 DAYS Yes Maisie Fus RN, Thea Silversmith Final Disposition 04/26/2023 12:13:17 PM SEE PCP WITHIN 3 DAYS Yes Maisie Fus, RN, Gwynne Edinger Disagree/Comply Comply Caller Understands Yes PreDisposition Did not know what to do Care Advice Given Per Guideline SEE PCP WITHIN 3 DAYS: CARE ADVICE given per Neurologic Deficit (Adult) guideline. CALL BACK IF: * Constant numbness or weakness in arms or legs * Problems with bowel or bladder control * You become worse

## 2023-04-29 ENCOUNTER — Ambulatory Visit (INDEPENDENT_AMBULATORY_CARE_PROVIDER_SITE_OTHER): Payer: Medicare Other | Admitting: Family Medicine

## 2023-04-29 ENCOUNTER — Encounter: Payer: Self-pay | Admitting: Family Medicine

## 2023-04-29 ENCOUNTER — Ambulatory Visit (HOSPITAL_BASED_OUTPATIENT_CLINIC_OR_DEPARTMENT_OTHER)
Admission: RE | Admit: 2023-04-29 | Discharge: 2023-04-29 | Disposition: A | Discharge status: Home / Self Care | Payer: Medicare Other | Source: Ambulatory Visit | Attending: Family Medicine | Admitting: Family Medicine

## 2023-04-29 VITALS — BP 126/82 | HR 72 | Ht 68.0 in | Wt 146.8 lb

## 2023-04-29 DIAGNOSIS — Z807 Family history of other malignant neoplasms of lymphoid, hematopoietic and related tissues: Secondary | ICD-10-CM

## 2023-04-29 DIAGNOSIS — R200 Anesthesia of skin: Secondary | ICD-10-CM

## 2023-04-29 DIAGNOSIS — M25561 Pain in right knee: Secondary | ICD-10-CM | POA: Insufficient documentation

## 2023-04-29 DIAGNOSIS — M1711 Unilateral primary osteoarthritis, right knee: Secondary | ICD-10-CM | POA: Diagnosis not present

## 2023-04-29 DIAGNOSIS — M8580 Other specified disorders of bone density and structure, unspecified site: Secondary | ICD-10-CM | POA: Diagnosis not present

## 2023-04-29 LAB — VITAMIN B12: Vitamin B-12: 1046 pg/mL — ABNORMAL HIGH (ref 211–911)

## 2023-04-29 LAB — COMPREHENSIVE METABOLIC PANEL
ALT: 17 U/L (ref 0–35)
AST: 24 U/L (ref 0–37)
Albumin: 4.4 g/dL (ref 3.5–5.2)
Alkaline Phosphatase: 70 U/L (ref 39–117)
BUN: 15 mg/dL (ref 6–23)
CO2: 28 mEq/L (ref 19–32)
Calcium: 10 mg/dL (ref 8.4–10.5)
Chloride: 103 mEq/L (ref 96–112)
Creatinine, Ser: 0.84 mg/dL (ref 0.40–1.20)
GFR: 74.98 mL/min (ref >60.00)
Glucose, Bld: 86 mg/dL (ref 70–99)
Potassium: 4.3 mEq/L (ref 3.5–5.1)
Sodium: 138 mEq/L (ref 135–145)
Total Bilirubin: 0.5 mg/dL (ref 0.2–1.2)
Total Protein: 7 g/dL (ref 6.0–8.3)

## 2023-04-29 LAB — VITAMIN D 25 HYDROXY (VIT D DEFICIENCY, FRACTURES): VITD: 53.55 ng/mL (ref 30.00–100.00)
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
## 2023-05-04 LAB — CBC+PLATELET+HEM REVIEW
BASO(ABSOLUTE): 0 10*3/uL (ref 0.0–0.2)
Basophils Manual: 0 %
EOS (ABSOLUTE VALUE): 0.2 10*3/uL (ref 0.0–0.4)
Eosinophils Manual: 2 %
Hematocrit: 40.4 % (ref 34.0–46.6)
Hemoglobin: 13.4 g/dL (ref 11.1–15.9)
Lymphocytes Absolute: 2.1 10*3/uL (ref 0.7–3.1)
Lymphocytes Manual: 27 %
MCH: 29.7 pg (ref 26.6–33.0)
MCHC: 33.2 g/dL (ref 31.5–35.7)
MCV: 90 fL (ref 79–97)
Monocytes Manual: 8 %
Monocytes(Absolute): 0.6 10*3/uL (ref 0.1–0.9)
Neutrophils Absolute: 4.8 10*3/uL (ref 1.4–7.0)
Neutrophils Manual: 63 %
PLATELET COMMENT: ADEQUATE
Platelets: 288 10*3/uL (ref 150–450)
RBC COMMENT: NORMAL
RBC: 4.51 x10E6/uL (ref 3.77–5.28)
RDW: 12.8 % (ref 11.7–15.4)
WBC: 7.6 10*3/uL (ref 3.4–10.8)

## 2023-05-20 DIAGNOSIS — L814 Other melanin hyperpigmentation: Secondary | ICD-10-CM | POA: Diagnosis not present

## 2023-05-20 DIAGNOSIS — D225 Melanocytic nevi of trunk: Secondary | ICD-10-CM | POA: Diagnosis not present

## 2023-05-20 DIAGNOSIS — L578 Other skin changes due to chronic exposure to nonionizing radiation: Secondary | ICD-10-CM | POA: Diagnosis not present

## 2023-05-20 DIAGNOSIS — D485 Neoplasm of uncertain behavior of skin: Secondary | ICD-10-CM | POA: Diagnosis not present

## 2023-05-20 DIAGNOSIS — L821 Other seborrheic keratosis: Secondary | ICD-10-CM | POA: Diagnosis not present

## 2023-05-20 DIAGNOSIS — L308 Other specified dermatitis: Secondary | ICD-10-CM | POA: Diagnosis not present

## 2023-05-20 DIAGNOSIS — B351 Tinea unguium: Secondary | ICD-10-CM | POA: Diagnosis not present

## 2023-06-26 DIAGNOSIS — L570 Actinic keratosis: Secondary | ICD-10-CM | POA: Diagnosis not present

## 2023-06-26 DIAGNOSIS — M71341 Other bursal cyst, right hand: Secondary | ICD-10-CM | POA: Diagnosis not present

## 2023-08-16 ENCOUNTER — Other Ambulatory Visit: Payer: Self-pay | Admitting: Family Medicine

## 2023-08-16 DIAGNOSIS — F419 Anxiety disorder, unspecified: Secondary | ICD-10-CM

## 2023-08-19 ENCOUNTER — Ambulatory Visit (INDEPENDENT_AMBULATORY_CARE_PROVIDER_SITE_OTHER): Payer: Medicare Other | Admitting: Family Medicine

## 2023-08-19 ENCOUNTER — Other Ambulatory Visit: Payer: Self-pay | Admitting: Family Medicine

## 2023-08-19 ENCOUNTER — Encounter: Payer: Self-pay | Admitting: Family Medicine

## 2023-08-19 VITALS — BP 116/72 | HR 56 | Temp 97.8°F | Resp 16 | Ht 68.0 in | Wt 152.6 lb

## 2023-08-19 DIAGNOSIS — Z0000 Encounter for general adult medical examination without abnormal findings: Secondary | ICD-10-CM | POA: Diagnosis not present

## 2023-08-19 DIAGNOSIS — F418 Other specified anxiety disorders: Secondary | ICD-10-CM

## 2023-08-19 DIAGNOSIS — M5416 Radiculopathy, lumbar region: Secondary | ICD-10-CM

## 2023-08-19 DIAGNOSIS — R232 Flushing: Secondary | ICD-10-CM | POA: Diagnosis not present

## 2023-08-19 DIAGNOSIS — B351 Tinea unguium: Secondary | ICD-10-CM | POA: Diagnosis not present

## 2023-08-19 DIAGNOSIS — I480 Paroxysmal atrial fibrillation: Secondary | ICD-10-CM | POA: Diagnosis not present

## 2023-08-19 DIAGNOSIS — G629 Polyneuropathy, unspecified: Secondary | ICD-10-CM | POA: Diagnosis not present

## 2023-08-19 LAB — CBC WITH DIFFERENTIAL/PLATELET
Basophils Absolute: 0 10*3/uL (ref 0.0–0.1)
Basophils Relative: 0.6 % (ref 0.0–3.0)
Eosinophils Absolute: 0.2 10*3/uL (ref 0.0–0.7)
Eosinophils Relative: 3 % (ref 0.0–5.0)
HCT: 42.5 % (ref 36.0–46.0)
Hemoglobin: 13.6 g/dL (ref 12.0–15.0)
Lymphocytes Relative: 24.3 % (ref 12.0–46.0)
Lymphs Abs: 1.6 10*3/uL (ref 0.7–4.0)
MCHC: 32 g/dL (ref 30.0–36.0)
MCV: 92.2 fL (ref 78.0–100.0)
Monocytes Absolute: 0.5 10*3/uL (ref 0.1–1.0)
Monocytes Relative: 7.8 % (ref 3.0–12.0)
Neutro Abs: 4.3 10*3/uL (ref 1.4–7.7)
Neutrophils Relative %: 64.3 % (ref 43.0–77.0)
Platelets: 275 10*3/uL (ref 150.0–400.0)
RBC: 4.61 Mil/uL (ref 3.87–5.11)
RDW: 14.7 % (ref 11.5–15.5)
WBC: 6.7 10*3/uL (ref 4.0–10.5)

## 2023-08-19 LAB — COMPREHENSIVE METABOLIC PANEL
ALT: 16 U/L (ref 0–35)
AST: 21 U/L (ref 0–37)
Albumin: 4.5 g/dL (ref 3.5–5.2)
Alkaline Phosphatase: 82 U/L (ref 39–117)
BUN: 12 mg/dL (ref 6–23)
CO2: 31 meq/L (ref 19–32)
Calcium: 10.2 mg/dL (ref 8.4–10.5)
Chloride: 103 meq/L (ref 96–112)
Creatinine, Ser: 0.66 mg/dL (ref 0.40–1.20)
GFR: 94.45 mL/min (ref >60.00)
Glucose, Bld: 98 mg/dL (ref 70–99)
Potassium: 4.6 meq/L (ref 3.5–5.1)
Sodium: 140 meq/L (ref 135–145)
Total Bilirubin: 0.6 mg/dL (ref 0.2–1.2)
Total Protein: 7.1 g/dL (ref 6.0–8.3)

## 2023-08-19 LAB — LIPID PANEL
Cholesterol: 207 mg/dL — ABNORMAL HIGH (ref 0–200)
HDL: 96.4 mg/dL (ref >39.00)
LDL Cholesterol: 95 mg/dL (ref 0–99)
NonHDL: 110.18
Total CHOL/HDL Ratio: 2
Triglycerides: 75 mg/dL (ref 0.0–149.0)
VLDL: 15 mg/dL (ref 0.0–40.0)

## 2023-08-19 LAB — TSH: TSH: 1.71 u[IU]/mL (ref 0.35–5.50)

## 2023-08-19 MED ORDER — VEOZAH 45 MG PO TABS
1.0000 | ORAL_TABLET | Freq: Every day | ORAL | 11 refills | Status: DC
Start: 2023-08-19 — End: 2023-08-19

## 2023-08-19 MED ORDER — CICLOPIROX 8 % EX SOLN
Freq: Every day | CUTANEOUS | 0 refills | Status: DC
Start: 2023-08-19 — End: 2024-08-25

## 2023-08-19 MED ORDER — VEOZAH 45 MG PO TABS: 1.0000 | ORAL_TABLET | Freq: Every day | ORAL | 11 refills | Status: DC

## 2023-08-19 NOTE — Assessment & Plan Note (Signed)
Ghm utd Check labs  See [facility] Health Maintenance  Topic Date Due   COVID-19 Vaccine (1) Never done   HIV Screening  Never done   Cervical Cancer Screening (HPV/Pap Cotest)  04/19/2013   INFLUENZA VACCINE  02/03/2024 (Originally 06/06/2023)   Medicare Annual Wellness (AWV)  01/30/2024   MAMMOGRAM  01/10/2025   DTaP/Tdap/Td (3 - Td or Tdap) 08/18/2032   Hepatitis C Screening  Completed   Zoster Vaccines- Shingrix  Completed   HPV VACCINES  Aged Out   Colonoscopy  Discontinued

## 2023-08-19 NOTE — Assessment & Plan Note (Signed)
Stable

## 2023-08-19 NOTE — Assessment & Plan Note (Signed)
On eliquis  Per cardiology

## 2023-08-19 NOTE — Progress Notes (Signed)
Established Patient Office Visit  Subjective   Patient ID: [name], female    DOB: 12-07-1960  Age: 62 y.o. MRN: [id number]  Chief Complaint  Patient presents with   Annual Exam    Pt states fasting     HPI Discussed the use of AI scribe software for clinical note transcription with the patient, who gave verbal consent to proceed.  History of Present Illness   The patient, with a history of multiple sclerosis, presents with persistent numbness and tingling in both legs. The symptoms began after receiving the second shingles vaccine and have progressively worsened over several months. Initially, the patient experienced burning and tingling in the feet, which gradually extended up to the knees and is now halfway up the thighs. The patient describes the sensation as if she has large blood pressure cuffs on her legs, with occasional pain radiating from the ankle upwards. The symptoms are worse at night and in the afternoons, particularly after being on her legs. The patient also reports a feeling of weakness in the legs, particularly when descending stairs.  The patient also reports ongoing issues with hot flashes for the past twelve years and a persistent toenail fungus. She has a history of an eye stroke and a pouch inflammation, which is managed but still causes occasional issues.      Patient Active Problem List   Diagnosis Date Noted   COVID-19 virus infection 11/27/2021   Depression with anxiety 08/15/2021   Preventative health care 08/15/2021   Paroxysmal atrial fibrillation (HCC) 08/15/2021   Nephrolithiasis 11/25/2017   Retinal embolus, right eye 08/21/2017   DOE (dyspnea on exertion) 07/05/2017   Dry eye syndrome of both lacrimal glands 01/17/2017   Nuclear sclerotic cataract of both eyes 01/17/2017   Pouchitis (HCC) 07/16/2016   Family history of ovarian cancer 06/27/2012   Allergic rhinitis 01/11/2011   CANDIDIASIS OF VULVA AND VAGINA 10/07/2009   HYPERGLYCEMIA  10/07/2009   Paroxysmal supraventricular tachycardia (HCC) 09/23/2009   Supraventricular premature beats 09/23/2009   Ulcerative colitis (HCC) 07/29/2009   PALPITATIONS 01/04/2009   ABDOMINAL ULTRASOUND, ABNORMAL 12/24/2008   CHEST PAIN UNSPECIFIED 12/19/2008   HERPES GENITALIS 12/14/2008   ABDOMINAL BLOATING 12/14/2008   Past Medical History:  Diagnosis Date   A-fib [facility])    Allergy    Anxiety    Arthritis    Cataract    Depression    Past Surgical History:  Procedure Laterality Date   BRAIN SURGERY  06/2005   Augmentation    COLON SURGERY  1998   Removal   COSMETIC SURGERY  2006   FRACTURE SURGERY  2010   iliostemy  03/2007   KIDNEY STONE SURGERY N/A    DR Cordelia Pen-- [facility]  urology   SHOULDER SURGERY Left    WRIST SURGERY Left    Social History   Tobacco Use   Smoking status: Never   Smokeless tobacco: Never  Substance Use Topics   Alcohol use: No   Drug use: No   Social History   Socioeconomic History   Marital status: Married    Spouse name: Not on file   Number of children: Not on file   Years of education: Not on file   Highest education level: Associate degree: occupational, Scientist, product/process development, or vocational program  Occupational History   Occupation: not working    Associate Professor: [employer]   Occupation: dental hygenist     Comment: on disability  Tobacco Use   Smoking status: Never  Smokeless tobacco: Never  Substance and Sexual Activity   Alcohol use: No   Drug use: No   Sexual activity: Not Currently    Partners: Male  Other Topics Concern   Not on file  Social History Narrative   Exercise--- no    Social Determinants of Health   Financial Resource Strain: Low Risk  (04/28/2023)   Overall Financial Resource Strain (CARDIA)    Difficulty of Paying Living Expenses: Not hard at all  Food Insecurity: No Food Insecurity (04/28/2023)   Hunger Vital Sign    Worried About Running Out of Food in the Last Year: Never true    Ran Out of Food in the Last  Year: Never true  Transportation Needs: No Transportation Needs (04/28/2023)   PRAPARE - Administrator, Civil Service (Medical): No    Lack of Transportation (Non-Medical): No  Physical Activity: Insufficiently Active (04/28/2023)   Exercise Vital Sign    Days of Exercise per Week: 2 days    Minutes of Exercise per Session: 30 min  Stress: No Stress Concern Present (04/28/2023)   [employer] of Occupational Health - Occupational Stress Questionnaire    Feeling of Stress : Only a little  Social Connections: Unknown (04/28/2023)   Social Connection and Isolation Panel [NHANES]    Frequency of Communication with Friends and Family: More than three times a week    Frequency of Social Gatherings with Friends and Family: Twice a week    Attends Religious Services: Patient declined    Database administrator or Organizations: No    Attends Banker Meetings: Patient declined    Marital Status: Married  Recent Concern: Social Connections - Moderately Isolated (01/29/2023)   Social Connection and Isolation Panel [NHANES]    Frequency of Communication with Friends and Family: Three times a week    Frequency of Social Gatherings with Friends and Family: Once a week    Attends Religious Services: Never    Database administrator or Organizations: No    Attends Banker Meetings: Patient declined    Marital Status: Married  Catering manager Violence: Not At Risk (01/30/2023)   Humiliation, Afraid, Rape, and Kick questionnaire    Fear of Current or Ex-Partner: No    Emotionally Abused: No    Physically Abused: No    Sexually Abused: No   Family Status  Relation Name Status   Father  Deceased   Mother  Deceased   Brother  Alive   Other  (Not Specified)  No partnership data on file   Family History  Problem Relation Age of Onset   Multiple myeloma Father    Cancer Father 19       multiple myeloma   Ovarian cancer Mother    Emphysema Mother    COPD  Mother    Hypertension Mother    Obesity Mother    Depression Mother    Diabetes Mother    Cancer Mother 16       ovarian   Anxiety disorder Other    Arthritis Brother    Allergies  Allergen Reactions   Estradiol Other (See Comments)    inflamed tissue    Codeine    Hydrocodone-Acetaminophen    Ciprofloxacin Hcl Itching    Possible allergy      Review of Systems  Constitutional:  Negative for chills, fever and malaise/fatigue.  HENT:  Negative for congestion and hearing loss.   Eyes:  Negative for blurred  vision and discharge.  Respiratory:  Negative for cough, sputum production and shortness of breath.   Cardiovascular:  Negative for chest pain, palpitations and leg swelling.  Gastrointestinal:  Negative for abdominal pain, blood in stool, constipation, diarrhea, heartburn, nausea and vomiting.  Genitourinary:  Negative for dysuria, frequency, hematuria and urgency.  Musculoskeletal:  Negative for back pain, falls and myalgias.  Skin:  Negative for rash.  Neurological:  Negative for dizziness, sensory change, loss of consciousness, weakness and headaches.  Endo/Heme/Allergies:  Negative for environmental allergies. Does not bruise/bleed easily.  Psychiatric/Behavioral:  Negative for depression and suicidal ideas. The patient is not nervous/anxious and does not have insomnia.       Objective:     BP 116/72 (BP Location: Left Arm, Patient Position: Sitting, Cuff Size: Normal)   Pulse (!) 56   Temp 97.8 F (36.6 C) (Oral)   Resp 16   Ht 5\' 8"  (1.727 m)   Wt 152 lb 9.6 oz (69.2 kg)   SpO2 98%   BMI 23.20 kg/m  BP Readings from Last 3 Encounters:  08/19/23 116/72  04/29/23 126/82  08/17/22 108/70   Wt Readings from Last 3 Encounters:  08/19/23 152 lb 9.6 oz (69.2 kg)  04/29/23 146 lb 12.8 oz (66.6 kg)  08/17/22 151 lb 6.4 oz (68.7 kg)   SpO2 Readings from Last 3 Encounters:  08/19/23 98%  04/29/23 98%  08/17/22 97%      Physical Exam Vitals and nursing  note reviewed.  Constitutional:      General: She is not in acute distress.    Appearance: Normal appearance. She is well-developed.  HENT:     Head: Normocephalic and atraumatic.     Right Ear: Tympanic membrane, ear canal and external ear normal. There is no impacted cerumen.     Left Ear: Tympanic membrane, ear canal and external ear normal. There is no impacted cerumen.     Nose: Nose normal.     Mouth/Throat:     Mouth: Mucous membranes are moist.     Pharynx: Oropharynx is clear. No oropharyngeal exudate or posterior oropharyngeal erythema.  Eyes:     General: No scleral icterus.       Right eye: No discharge.        Left eye: No discharge.     Conjunctiva/sclera: Conjunctivae normal.     Pupils: Pupils are equal, round, and reactive to light.  Neck:     Thyroid: No thyromegaly or thyroid tenderness.     Vascular: No JVD.  Cardiovascular:     Rate and Rhythm: Normal rate and regular rhythm.     Heart sounds: Normal heart sounds. No murmur heard. Pulmonary:     Effort: Pulmonary effort is normal. No respiratory distress.     Breath sounds: Normal breath sounds.  Abdominal:     General: Bowel sounds are normal. There is no distension.     Palpations: Abdomen is soft. There is no mass.     Tenderness: There is no abdominal tenderness. There is no guarding or rebound.  Genitourinary:    Vagina: Normal.  Musculoskeletal:        General: No tenderness or signs of injury. Normal range of motion.     Cervical back: Normal range of motion and neck supple.     Right lower leg: No edema.     Left lower leg: No edema.  Lymphadenopathy:     Cervical: No cervical adenopathy.  Skin:    General: Skin is  warm and dry.     Findings: No erythema or rash.  Neurological:     General: No focal deficit present.     Mental Status: She is alert and oriented to person, place, and time.     Cranial Nerves: No cranial nerve deficit.     Sensory: No sensory deficit.     Motor: No weakness.      Coordination: Coordination normal.     Gait: Gait normal.     Deep Tendon Reflexes: Reflexes are normal and symmetric. Reflexes normal.  Psychiatric:        Mood and Affect: Mood normal.        Behavior: Behavior normal.        Thought Content: Thought content normal.        Judgment: Judgment normal.      No results found for any visits on 08/19/23.  Last CBC Lab Results  Component Value Date   WBC 7.6 04/29/2023   HGB 13.4 04/29/2023   HCT 40.4 04/29/2023   MCV 90 04/29/2023   MCH 29.7 04/29/2023   RDW 12.8 04/29/2023   PLT 288 04/29/2023   Last metabolic panel Lab Results  Component Value Date   GLUCOSE 86 04/29/2023   NA 138 04/29/2023   K 4.3 04/29/2023   CL 103 04/29/2023   CO2 28 04/29/2023   BUN 15 04/29/2023   CREATININE 0.84 04/29/2023   GFR 74.98 04/29/2023   CALCIUM 10.0 04/29/2023   PROT 7.0 04/29/2023   ALBUMIN 4.4 04/29/2023   BILITOT 0.5 04/29/2023   ALKPHOS 70 04/29/2023   AST 24 04/29/2023   ALT 17 04/29/2023   Last lipids Lab Results  Component Value Date   CHOL 195 08/17/2022   HDL 92.60 08/17/2022   LDLCALC 89 08/17/2022   TRIG 70.0 08/17/2022   CHOLHDL 2 08/17/2022   Last hemoglobin A1c Lab Results  Component Value Date   HGBA1C 5.5 10/13/2010   Last thyroid functions Lab Results  Component Value Date   TSH 1.60 12/04/2011   Last vitamin D Lab Results  Component Value Date   VD25OH 53.55 04/29/2023   Last vitamin B12 and Folate Lab Results  Component Value Date   VITAMINB12 1,046 (H) 04/29/2023      The 10-year ASCVD risk score (Arnett DK, et al., 2019) is: 2.1%    Assessment & Plan:   Problem List Items Addressed This Visit       Unprioritized   Preventative health care - Primary    Ghm utd Check labs  See AVS Health Maintenance  Topic Date Due   COVID-19 Vaccine (1) Never done   HIV Screening  Never done   Cervical Cancer Screening (HPV/Pap Cotest)  04/19/2013   INFLUENZA VACCINE  02/03/2024  (Originally 06/06/2023)   Medicare Annual Wellness (AWV)  01/30/2024   MAMMOGRAM  01/10/2025   DTaP/Tdap/Td (3 - Td or Tdap) 08/18/2032   Hepatitis C Screening  Completed   Zoster Vaccines- Shingrix  Completed   HPV VACCINES  Aged Out   Colonoscopy  Discontinued         Relevant Orders   CBC with Differential/Platelet   Comprehensive metabolic panel   Lipid panel   TSH   Paroxysmal atrial fibrillation (HCC)    On eliquis Per cardiology      Depression with anxiety    Stable        Other Visit Diagnoses     Neuropathy       Relevant Orders  Ambulatory referral to Neurology   Lumbar radiculopathy       Relevant Orders   Ambulatory referral to Physical Therapy   Hot flashes       Relevant Medications   Fezolinetant (VEOZAH) 45 MG TABS   Onychomycosis       Relevant Medications   ciclopirox (PENLAC) 8 % solution     Assessment and Plan    Peripheral Neuropathy Bilateral lower extremity numbness, tingling, and burning sensation progressing from feet to thighs over several months. No significant findings on previous imaging. Possible association with shingles vaccine, but this is rare and usually affects the arms. Some weakness noted, particularly when descending stairs. -Referral to neurologist for further evaluation. -Initiate physical therapy for potential back-related issues.  Hot Flashes Ongoing for 12 years, seeking new treatment options. -Prescribed Veozah, a non-hormonal treatment. Will monitor for insurance coverage and efficacy.  Onychomycosis Persistent toenail fungus, previous treatment with Lamisil unsuccessful. -Prescribed Penlac. If ineffective, consider referral back to podiatrist.  General Health Maintenance -Continue Lexapro as prescribed. -Declined flu vaccine.        No follow-ups on file.    Donato Schultz, DO

## 2023-08-21 ENCOUNTER — Other Ambulatory Visit (HOSPITAL_COMMUNITY): Payer: Self-pay

## 2023-08-21 ENCOUNTER — Telehealth: Payer: Self-pay

## 2023-08-21 NOTE — Telephone Encounter (Signed)
PA has been submitted and documented in separate encounter, please sign off on rx in this encounter as PA team is unable to resolve RX requests. Thank you

## 2023-08-21 NOTE — Telephone Encounter (Signed)
Pharmacy Patient Advocate Encounter   Received notification from RX Request Messages that prior authorization for Veozah 45MG  is required/requested.   Insurance verification completed.   The patient is insured through [facility] .   Per test claim: PA required; PA submitted to [facility] via CoverMyMeds Key/confirmation #/EOC [id number] Status is pending

## 2023-08-22 NOTE — Telephone Encounter (Signed)
P called stating that the [facility] rep needed some additional information regarding her PA as to why she cannot take any alternatives. Pt provided the following info for [place] to reach out to insurance:  [facility] [phone] Auth: [id number]

## 2023-08-27 NOTE — Telephone Encounter (Signed)
FYI See below 

## 2023-08-27 NOTE — Telephone Encounter (Signed)
Pharmacy Patient Advocate Encounter  Received notification from [facility] that Prior Authorization for Veozah 45mg  Tablet has been DENIED.  Full denial letter will be uploaded to the media tab. See denial reason below.  This is denied because the information provided was not sufficient to support approval for medical necessity. The following required information was not provided and/or clarified:  (1) The specific medical reasons why you are unable to use four (4) of the following covered drugs:  (A) Elestrin or estradiol tablet (B) Menest (C) Paroxetine Hydrochloride tablet (D) Premarin tablet  PA #/Case ID/Reference #: [id number]   Please be advised we currently do not have a Pharmacist to review denials, therefore you will need to process appeals accordingly as needed. Thanks for your support at this time. Contact for appeals are as follows: Phone: [phone], Fax: [phone]

## 2023-08-30 NOTE — Telephone Encounter (Signed)
Pt made aware
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
## 2023-09-04 ENCOUNTER — Ambulatory Visit: Payer: Medicare Other | Attending: Family Medicine | Admitting: Physical Therapy

## 2023-09-04 ENCOUNTER — Other Ambulatory Visit: Payer: Self-pay

## 2023-09-04 ENCOUNTER — Encounter: Payer: Self-pay | Admitting: Physical Therapy

## 2023-09-04 DIAGNOSIS — M5416 Radiculopathy, lumbar region: Secondary | ICD-10-CM | POA: Insufficient documentation

## 2023-09-04 DIAGNOSIS — M79605 Pain in left leg: Secondary | ICD-10-CM | POA: Insufficient documentation

## 2023-09-04 DIAGNOSIS — M79604 Pain in right leg: Secondary | ICD-10-CM | POA: Insufficient documentation

## 2023-09-04 DIAGNOSIS — R2681 Unsteadiness on feet: Secondary | ICD-10-CM | POA: Diagnosis not present

## 2023-09-19 ENCOUNTER — Other Ambulatory Visit: Payer: Self-pay

## 2023-09-19 ENCOUNTER — Ambulatory Visit: Payer: Medicare Other | Attending: Family Medicine

## 2023-09-19 DIAGNOSIS — M79605 Pain in left leg: Secondary | ICD-10-CM | POA: Diagnosis not present

## 2023-09-19 DIAGNOSIS — M79604 Pain in right leg: Secondary | ICD-10-CM | POA: Diagnosis not present

## 2023-09-19 DIAGNOSIS — R2681 Unsteadiness on feet: Secondary | ICD-10-CM | POA: Insufficient documentation
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
## 2023-09-24 ENCOUNTER — Other Ambulatory Visit: Payer: Self-pay | Admitting: Family Medicine

## 2023-09-24 DIAGNOSIS — M5416 Radiculopathy, lumbar region: Secondary | ICD-10-CM

## 2023-09-30 DIAGNOSIS — I480 Paroxysmal atrial fibrillation: Secondary | ICD-10-CM | POA: Diagnosis not present

## 2023-09-30 DIAGNOSIS — R072 Precordial pain: Secondary | ICD-10-CM | POA: Diagnosis not present

## 2023-10-01 ENCOUNTER — Ambulatory Visit: Payer: Medicare Other | Admitting: Physical Therapy

## 2023-10-01 ENCOUNTER — Encounter: Payer: Self-pay | Admitting: Physical Therapy

## 2023-10-01 DIAGNOSIS — R2681 Unsteadiness on feet: Secondary | ICD-10-CM

## 2023-10-01 DIAGNOSIS — M79604 Pain in right leg: Secondary | ICD-10-CM | POA: Diagnosis not present

## 2023-10-01 DIAGNOSIS — M79605 Pain in left leg: Secondary | ICD-10-CM | POA: Diagnosis not present
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
## 2023-10-07 ENCOUNTER — Encounter: Payer: Self-pay | Admitting: Family Medicine

## 2023-10-07 ENCOUNTER — Ambulatory Visit (INDEPENDENT_AMBULATORY_CARE_PROVIDER_SITE_OTHER): Payer: Medicare Other | Admitting: Family Medicine

## 2023-10-07 ENCOUNTER — Telehealth: Payer: Self-pay | Admitting: Family Medicine

## 2023-10-07 VITALS — BP 144/66 | HR 75 | Ht 68.0 in | Wt 151.0 lb

## 2023-10-07 DIAGNOSIS — R5383 Other fatigue: Secondary | ICD-10-CM

## 2023-10-07 DIAGNOSIS — R29898 Other symptoms and signs involving the musculoskeletal system: Secondary | ICD-10-CM

## 2023-10-07 DIAGNOSIS — R2689 Other abnormalities of gait and mobility: Secondary | ICD-10-CM

## 2023-10-07 DIAGNOSIS — R1013 Epigastric pain: Secondary | ICD-10-CM

## 2023-10-07 NOTE — Telephone Encounter (Signed)
Pt seen by Ladona Ridgelaylor

## 2023-10-07 NOTE — Telephone Encounter (Signed)
Initial Comment Caller states she has an appt at the end of the month but is having worsening systems. She has had her colon removed and is now having extreme bloating and gas pains (not severe). She believes she may have MS. She would like an earlier appt. She also had tightness in her chest and pain in her jaw last week and believes it may have been an MS hug. Translation No Nurse Assessment Nurse: Carylon Perches, RN, Hilda Lias Date/Time (Eastern Time): 10/07/2023 9:24:29 AM Confirm and document reason for call. If symptomatic, describe symptoms. ---Caller states she is having a stress test tomorrow from chest pain. States she is having bloating and indigestion for a week. Also having numbness and tingling since March 2024, it is constant. Caller states she might have MS and would like to be evaluated for that. Currently not having chest pain. Does the patient have any new or worsening symptoms? ---Yes Will a triage be completed? ---Yes Related visit to physician within the last 2 weeks? ---No Does the PT have any chronic conditions? (i.e. diabetes, asthma, this includes High risk factors for pregnancy, etc.) ---Yes List chronic conditions. ---Afib, depression Is this a behavioral health or substance abuse call? ---No Guidelines Guideline Title Affirmed Question Affirmed Notes Nurse Date/Time Lamount Cohen Time) Abdomen Bloating and Swelling [1] Artist Pais, RN, Hilda Lias 10/07/2023 9:30:42 AM PLEASE NOTE: All timestamps contained within this report are represented as [place] Standard Time. CONFIDENTIALTY NOTICE: This fax transmission is intended only for the addressee. It contains information that is legally privileged, confidential or otherwise protected from use or disclosure. If you are not the intended recipient, you are strictly prohibited from reviewing, disclosing, copying using or disseminating any of this information or taking any action in reliance on or regarding this  information. If you have received this fax in error, please notify us immediately by telephone so that we can arrange for its return to [place]. Phone: [phone], Toll-Free: [phone], Fax: [phone] Page: 2 of 2 Call Id: [id number] Guidelines Guideline Title Affirmed Question Affirmed Notes Nurse Date/Time Lamount Cohen Time) abdomen pain AND [2] constant AND [3] present > 2 hours Disp. Time Lamount Cohen Time) Disposition Final User 10/07/2023 9:19:58 AM Send to Urgent Stevphen Rochester 10/07/2023 9:32:25 AM See HCP within 4 Hours (or PCP triage) Yes Carylon Perches, RN, Hilda Lias Final Disposition 10/07/2023 9:32:25 AM See HCP within 4 Hours (or PCP triage) Yes Carylon Perches, RN, Seward Grater Disagree/Comply Comply Caller Understands Yes PreDisposition Did not know what to do Care Advice Given Per Guideline SEE HCP (OR PCP TRIAGE) WITHIN 4 HOURS: * IF OFFICE WILL BE OPEN: You need to be seen within the next 3 or 4 hours. Call your doctor (or NP/PA) now or as soon as the office opens. CARE ADVICE given per Abdomen Bloating and Swelling (Adult) guideline. CALL BACK IF: * You become worse Comments User: Laverta Baltimore, RN Date/Time Lamount Cohen Time): 10/07/2023 9:38:27 AM Appt scheduled for 1040 this morning Referrals REFERRED TO PCP OFFICE

## 2023-10-07 NOTE — Telephone Encounter (Signed)
FYI. Call sent to triage

## 2023-10-07 NOTE — Telephone Encounter (Signed)
Pt called to advise that her symptoms have worsened. Pt wanted to know if Dr. Zola Button could have her appt moved up sooner because she is getting concerned (current appt 1/28). Pt said that she is having digestive issues(bloating) and she said she has been having chest tightness and pain as well as jaw pain and it stopped her in her tracks. Pt thinks it might be a "MS HUG" I transferred pt to triage because of the jaw/chest pain but please call pt to advise if PCP can assist in getting sooner appt.

## 2023-10-07 NOTE — Progress Notes (Signed)
Acute Office Visit  Subjective:     Patient ID: [name], female    DOB: May 27, 1961, 62 y.o.   MRN: [id number]  Chief Complaint  Patient presents with   Abdominal Pain     Patient is in today for bloating and leg weakness.   Discussed the use of AI scribe software for clinical note transcription with the patient, who gave verbal consent to proceed.  History of Present Illness   The patient, with a history of ulcerative colitis and subsequent colectomy (1998) with ileostomy and pouch creation, presented with concerns of progressive numbness, tingling, and burning sensations in the lower extremities, initially localized to the feet but gradually extending to the mid-thigh region. These symptoms began after receiving a shingles vaccine in March of the current year. The patient also reported intermittent leg weakness, balance issues, and a sensation of clumsiness, particularly in the mornings. The patient denied any significant pain, falls, or the need for assistive devices for ambulation.  The patient also reported increased fatigue, occasional difficulty with word-finding, and night sweats, which they initially attributed to menopause. However, the patient expressed concern that these symptoms might be indicative of multiple sclerosis (MS), given their progression and the patient's history of autoimmune disease.  In addition to the neurological symptoms, the patient reported new-onset digestive issues, characterized by severe indigestion and a sensation of bloating and distension, particularly after eating. The patient described their abdomen as feeling hard and distended intermittently, even without food intake. The patient has a history of colon removal due to severe ulcerative colitis and now has a pouch created from the small intestine, which they reported occasionally causes pressure on the bladder and frequent bowel movements.  The patient also reported an episode of severe chest  pain that radiated to the jaw while doing yard work 3 weeks ago, which resolved after approximately three minutes. The patient has a known history of atrial fibrillation. The chest pain episode prompted a scheduled stress test with cardiologist - planned for tomorrow. She has not had any recurrent chest pain symptoms since the initial episode.   The patient's concerns have led to increased anxiety and a desire for expedited neurological evaluation, with an appointment already scheduled for late January. An MRI of the lumbar spine is also scheduled for later this month. The patient is proactive in their healthcare, given their past experience with delayed diagnosis of ulcerative colitis.            All review of systems negative except what is listed in the HPI      Objective:    BP (!) 144/66   Pulse 75   Ht 5\' 8"  (1.727 m)   Wt 151 lb (68.5 kg)   SpO2 99%   BMI 22.96 kg/m    Physical Exam Vitals reviewed.  Constitutional:      Appearance: Normal appearance.  Cardiovascular:     Rate and Rhythm: Normal rate and regular rhythm.     Heart sounds: Normal heart sounds.  Pulmonary:     Effort: Pulmonary effort is normal.     Breath sounds: Normal breath sounds.  Abdominal:     General: Abdomen is flat. There is no distension.     Palpations: Abdomen is soft.     Tenderness: There is no abdominal tenderness.  Skin:    General: Skin is warm and dry.  Neurological:     Mental Status: She is alert and oriented to person, place, and time.  Psychiatric:  Mood and Affect: Mood normal.        Behavior: Behavior normal.        Thought Content: Thought content normal.        Judgment: Judgment normal.     No results found for any visits on 10/07/23.      Assessment & Plan:   Problem List Items Addressed This Visit   None Visit Diagnoses     Leg weakness, bilateral    -  Primary   Relevant Orders   Ambulatory referral to Neurology   Fatigue, unspecified type        Relevant Orders   Ambulatory referral to Neurology   Balance problem       Relevant Orders   Ambulatory referral to Neurology   Epigastric discomfort       Relevant Orders   H. pylori breath test          Peripheral Neuropathy, Fatigue, Brain Fog, Weakness, Balance Impairment Progressive numbness, tingling, and weakness in lower extremities since March, newer onset bilateral leg weakness and clumsiness, catching herself from stumbling, and some brain fog with occasionally forgetting words to finish sentences. Patient concerned about MS. MRI scheduled for 10/25/2023. Neurology appointment scheduled for 12/03/2023. -Continue with physical therapy for balance and strengthening. -Continue with scheduled Lumbar MRI and neurology appointment. Will try to get sooner neuro appointment given symptom progression.  Epigastric discomfort, indigestion Recent onset of indigestion and bloating. History of colon removal and ileostomy. No nausea, vomiting, or severe pain. Passing gas and stool. No blood in stool. -Perform H Pylori test to rule out ulcer-causing bacteria. Will have to be delayed given she took Pepto yesterday. -Follow-up for worsening symptoms. If severe pain, nausea, or vomiting occur, patient to go to ED.   Chest Pain Recent episode of severe chest pain radiating to jaw, resolved after 3 minutes. History of AFib. Stress test scheduled for 10/08/2023. -Continue with scheduled stress test for tomorrow and cardiology follow-up -If chest pain recurs, patient to go to hospital.  Hypertension Blood pressure slightly elevated at visit, possibly due to stress. -Monitor blood pressure daily for two weeks and report readings to PCP,  Dr. Laury Axon.        No orders of the defined types were placed in this encounter.   Return if symptoms worsen or fail to improve.  Janet Dana, NP

## 2023-10-15 ENCOUNTER — Ambulatory Visit: Payer: Medicare Other | Admitting: Diagnostic Neuroimaging

## 2023-10-15 ENCOUNTER — Encounter: Payer: Self-pay | Admitting: Diagnostic Neuroimaging

## 2023-10-15 VITALS — BP 123/69 | HR 64 | Ht 68.0 in | Wt 151.0 lb

## 2023-10-15 DIAGNOSIS — R200 Anesthesia of skin: Secondary | ICD-10-CM

## 2023-10-15 DIAGNOSIS — R072 Precordial pain: Secondary | ICD-10-CM | POA: Diagnosis not present

## 2023-10-15 DIAGNOSIS — R202 Paresthesia of skin: Secondary | ICD-10-CM

## 2023-10-15 NOTE — Progress Notes (Signed)
[facility]  PATIENT: [name] DOB: 02/04/1961  REFERRING CLINICIAN: Donato Schultz, * HISTORY FROM: patient  REASON FOR VISIT: new consult   HISTORICAL  CHIEF COMPLAINT:  Chief Complaint  Patient presents with   New Patient (Initial Visit)    Patient in room #6 and alone. Patient states she been having weakness, burning and tingling sensation in her feet and lower legs and thighs for the past few months. Patient states it started after her second shingle vaccine. Patient state she has A-fib.    HISTORY OF PRESENT ILLNESS:   62 year old female here for evaluation of numbness and burning in feet.  Symptom started March 2024.  Started in the toes and feet has progressed gradually up to the knees and thighs bilaterally over several months.  Over past 3 months symptoms have been fairly stable.  Last week she did notice some tingling sensation in her left arm.  Several weeks ago also noted an episode of chest pressure, jaw pain, anxiety sensation.  She has been concerned about neurologic condition such as multiple sclerosis.   REVIEW OF SYSTEMS: Full 14 system review of systems performed and negative with exception of: as per HPI.  ALLERGIES: Allergies  Allergen Reactions   Estradiol Other (See Comments)    inflamed tissue    Codeine    Hydrocodone-Acetaminophen    Ciprofloxacin Hcl Itching    Possible allergy    HOME MEDICATIONS: Outpatient Medications Prior to Visit  Medication Sig Dispense Refill   apixaban (ELIQUIS) 5 MG TABS tablet Take by mouth.     ciclopirox (PENLAC) 8 % solution Apply topically at bedtime. Apply over nail and surrounding skin. Apply daily over previous coat. After seven (7) days, may remove with alcohol and continue cycle. 6.6 mL 0   clonazePAM (KLONOPIN) 1 MG tablet 1 po qhs prn 30 tablet 1   escitalopram (LEXAPRO) 20 MG tablet TAKE 1 TABLET BY MOUTH EVERY DAY 90 tablet 1   sodium bicarbonate 650 MG tablet Take 650 mg  by mouth 2 (two) times daily.     No facility-administered medications prior to visit.    PAST MEDICAL HISTORY: Past Medical History:  Diagnosis Date   A-fib [facility])    Allergy    Anxiety    Arthritis    Cataract    Depression     PAST SURGICAL HISTORY: Past Surgical History:  Procedure Laterality Date   BRAIN SURGERY  06/2005   Augmentation    COLON SURGERY  1998   Removal   COSMETIC SURGERY  2006   FRACTURE SURGERY  2010   iliostemy  03/2007   KIDNEY STONE SURGERY N/A    DR Cordelia Pen-- [facility]  urology   SHOULDER SURGERY Left    WRIST SURGERY Left     FAMILY HISTORY: Family History  Problem Relation Age of Onset   Multiple myeloma Father    Cancer Father 60       multiple myeloma   Ovarian cancer Mother    Emphysema Mother    COPD Mother    Hypertension Mother    Obesity Mother    Depression Mother    Diabetes Mother    Cancer Mother 58       ovarian   Anxiety disorder Other    Arthritis Brother     SOCIAL HISTORY: Social History   Socioeconomic History   Marital status: Married    Spouse name: Not on file   Number of children: Not  on file   Years of education: Not on file   Highest education level: Associate degree: occupational, Scientist, product/process development, or vocational program  Occupational History   Occupation: not working    Associate Professor: [employer]   Occupation: Tax inspector     Comment: on disability  Tobacco Use   Smoking status: Never   Smokeless tobacco: Never  Substance and Sexual Activity   Alcohol use: No   Drug use: No   Sexual activity: Not Currently    Partners: Male  Other Topics Concern   Not on file  Social History Narrative   Exercise--- no    Social Determinants of Health   Financial Resource Strain: Low Risk  (04/28/2023)   Overall Financial Resource Strain (CARDIA)    Difficulty of Paying Living Expenses: Not hard at all  Food Insecurity: No Food Insecurity (04/28/2023)   Hunger Vital Sign    Worried About Running Out of Food in  the Last Year: Never true    Ran Out of Food in the Last Year: Never true  Transportation Needs: No Transportation Needs (04/28/2023)   PRAPARE - Administrator, Civil Service (Medical): No    Lack of Transportation (Non-Medical): No  Physical Activity: Insufficiently Active (04/28/2023)   Exercise Vital Sign    Days of Exercise per Week: 2 days    Minutes of Exercise per Session: 30 min  Stress: No Stress Concern Present (04/28/2023)   [employer] of Occupational Health - Occupational Stress Questionnaire    Feeling of Stress : Only a little  Social Connections: Unknown (04/28/2023)   Social Connection and Isolation Panel [NHANES]    Frequency of Communication with Friends and Family: More than three times a week    Frequency of Social Gatherings with Friends and Family: Twice a week    Attends Religious Services: Patient declined    Database administrator or Organizations: No    Attends Banker Meetings: Patient declined    Marital Status: Married  Recent Concern: Social Connections - Moderately Isolated (01/29/2023)   Social Connection and Isolation Panel [NHANES]    Frequency of Communication with Friends and Family: Three times a week    Frequency of Social Gatherings with Friends and Family: Once a week    Attends Religious Services: Never    Database administrator or Organizations: No    Attends Banker Meetings: Patient declined    Marital Status: Married  Catering manager Violence: Not At Risk (01/30/2023)   Humiliation, Afraid, Rape, and Kick questionnaire    Fear of Current or Ex-Partner: No    Emotionally Abused: No    Physically Abused: No    Sexually Abused: No     PHYSICAL EXAM  GENERAL EXAM/CONSTITUTIONAL: Vitals:  Vitals:   10/15/23 0910  BP: 123/69  Pulse: 64  Weight: 151 lb (68.5 kg)  Height: 5\' 8"  (1.727 m)   Body mass index is 22.96 kg/m. Wt Readings from Last 3 Encounters:  10/15/23 151 lb (68.5 kg)   10/07/23 151 lb (68.5 kg)  08/19/23 152 lb 9.6 oz (69.2 kg)   Patient is in no distress; well developed, nourished and groomed; neck is supple  CARDIOVASCULAR: Examination of carotid arteries is normal; no carotid bruits Regular rate and rhythm, no murmurs Examination of peripheral vascular system by observation and palpation is normal  EYES: Ophthalmoscopic exam of optic discs and posterior segments is normal; no papilledema or hemorrhages No results found.  MUSCULOSKELETAL: Gait, strength, tone,  movements noted in Neurologic exam below  NEUROLOGIC: MENTAL STATUS:      No data to display         awake, alert, oriented to person, place and time recent and remote memory intact normal attention and concentration language fluent, comprehension intact, naming intact fund of knowledge appropriate  CRANIAL NERVE:  2nd - no papilledema on fundoscopic exam 2nd, 3rd, 4th, 6th - pupils equal and reactive to light, visual fields full to confrontation, extraocular muscles intact, no nystagmus 5th - facial sensation symmetric 7th - facial strength symmetric 8th - hearing intact 9th - palate elevates symmetrically, uvula midline 11th - shoulder shrug symmetric 12th - tongue protrusion midline  MOTOR:  normal bulk and tone, full strength in the BUE, BLE  SENSORY:  normal and symmetric to light touch, temperature, vibration  COORDINATION:  finger-nose-finger, fine finger movements normal  REFLEXES:  deep tendon reflexes 2+ and symmetric; 1+ at ankles  GAIT/STATION:  narrow based gait     DIAGNOSTIC DATA (LABS, IMAGING, TESTING) - I reviewed patient records, labs, notes, testing and imaging myself where available.  Lab Results  Component Value Date   WBC 6.7 08/19/2023   HGB 13.6 08/19/2023   HCT 42.5 08/19/2023   MCV 92.2 08/19/2023   PLT 275.0 08/19/2023      Component Value Date/Time   NA 140 08/19/2023 1117   K 4.6 08/19/2023 1117   CL 103 08/19/2023  1117   CO2 31 08/19/2023 1117   GLUCOSE 98 08/19/2023 1117   BUN 12 08/19/2023 1117   CREATININE 0.66 08/19/2023 1117   CALCIUM 10.2 08/19/2023 1117   PROT 7.1 08/19/2023 1117   ALBUMIN 4.5 08/19/2023 1117   AST 21 08/19/2023 1117   ALT 16 08/19/2023 1117   ALKPHOS 82 08/19/2023 1117   BILITOT 0.6 08/19/2023 1117   GFRNONAA 84.71 10/13/2010 0940   GFRAA  12/18/2008 1535    >60        The eGFR has been calculated using the MDRD equation. This calculation has not been validated in all clinical situations. eGFR's persistently <60 mL/min signify possible Chronic Kidney Disease.   Lab Results  Component Value Date   CHOL 207 (H) 08/19/2023   HDL 96.40 08/19/2023   LDLCALC 95 08/19/2023   TRIG 75.0 08/19/2023   CHOLHDL 2 08/19/2023   Lab Results  Component Value Date   HGBA1C 5.5 10/13/2010   Lab Results  Component Value Date   VITAMINB12 1,046 (H) 04/29/2023   Lab Results  Component Value Date   TSH 1.71 08/19/2023      ASSESSMENT AND PLAN  62 y.o. year old female here with:  Dx:  1. Numbness and tingling     PLAN:  BURNING FEET, NUMBNESS IN THIGHS, LEFT ARM TINGLING (since March 2024; following shingles vaccine) - check EMG/NCS for neuropathy / CIDP evaluation  TRANSIENT JAW PAIN, CHEST PRESSURE, ANXIETY, BALANCE DIFFICULTY - check MRI brain w/wo (demyelinating disease eval) - follow up with cardiology  TRANSIENT RIGHT EYE VISION LOSS (2018; 2 minute event; in setting of new onset atrial fibrillation) - continue eliquis  Orders Placed This Encounter  Procedures   MR BRAIN W WO CONTRAST   NCV with EMG(electromyography)   Return for for NCV/EMG.    Suanne Marker, MD 10/15/2023, 10:14 AM Certified in Neurology, Neurophysiology and Neuroimaging  [facility] [address] [place], [place] [address] [phone]

## 2023-10-17 ENCOUNTER — Encounter: Payer: Medicare Other | Admitting: Physical Therapy

## 2023-10-21 ENCOUNTER — Other Ambulatory Visit: Payer: Medicare Other

## 2023-10-21 DIAGNOSIS — R1013 Epigastric pain: Secondary | ICD-10-CM

## 2023-10-21 NOTE — Progress Notes (Signed)
Pt completed breath test.

## 2023-10-24 DIAGNOSIS — R9439 Abnormal result of other cardiovascular function study: Secondary | ICD-10-CM | POA: Diagnosis not present

## 2023-10-24 DIAGNOSIS — I480 Paroxysmal atrial fibrillation: Secondary | ICD-10-CM | POA: Diagnosis not present

## 2023-10-24 DIAGNOSIS — Z7901 Long term (current) use of anticoagulants: Secondary | ICD-10-CM | POA: Diagnosis not present

## 2023-10-24 DIAGNOSIS — Z79899 Other long term (current) drug therapy: Secondary | ICD-10-CM | POA: Diagnosis not present

## 2023-10-24 DIAGNOSIS — Z8673 Personal history of transient ischemic attack (TIA), and cerebral infarction without residual deficits: Secondary | ICD-10-CM | POA: Diagnosis not present

## 2023-10-24 DIAGNOSIS — I2510 Atherosclerotic heart disease of native coronary artery without angina pectoris: Secondary | ICD-10-CM | POA: Diagnosis not present

## 2023-10-24 LAB — H. PYLORI BREATH TEST: H. pylori Breath Test: NOT DETECTED

## 2023-10-25 ENCOUNTER — Ambulatory Visit
Admission: RE | Admit: 2023-10-25 | Discharge: 2023-10-25 | Disposition: A | Discharge status: Home / Self Care | Payer: Medicare Other | Source: Ambulatory Visit | Attending: Family Medicine | Admitting: Family Medicine

## 2023-10-25 DIAGNOSIS — M5416 Radiculopathy, lumbar region: Secondary | ICD-10-CM | POA: Diagnosis not present

## 2023-11-04 ENCOUNTER — Telehealth: Payer: Self-pay

## 2023-11-04 NOTE — Telephone Encounter (Signed)
Copied from CRM [id number]. Topic: Clinical - Lab/Test Results >> Nov 04, 2023 12:32 PM Chantha C wrote: Reason for CRM: Pt wants to know MRI results, pls c/b [phone]

## 2023-11-04 NOTE — Telephone Encounter (Signed)
Results not reviewed by radiologist yet

## 2023-11-07 ENCOUNTER — Telehealth: Payer: Self-pay

## 2023-11-07 ENCOUNTER — Ambulatory Visit: Payer: Self-pay | Admitting: Diagnostic Neuroimaging

## 2023-11-07 ENCOUNTER — Ambulatory Visit: Payer: Medicare Other | Admitting: Diagnostic Neuroimaging

## 2023-11-07 DIAGNOSIS — Z0289 Encounter for other administrative examinations: Secondary | ICD-10-CM

## 2023-11-07 DIAGNOSIS — R202 Paresthesia of skin: Secondary | ICD-10-CM

## 2023-11-07 DIAGNOSIS — R200 Anesthesia of skin: Secondary | ICD-10-CM | POA: Diagnosis not present

## 2023-11-07 NOTE — Telephone Encounter (Signed)
 Copied from CRM [id number]. Topic: Clinical - Lab/Test Results >> Nov 07, 2023  9:44 AM Graeme ORN wrote: Reason for CRM: Patient called back in to check on MRI results. Last update from 12/30 has not yet been reviewed by radiologist. Patient states was told results would only take 5 days and it has been two weeks and she wanted to provide results to her neurologist today before her nerve study. Thank You

## 2023-11-07 NOTE — Procedures (Signed)
 [facility]  NCS (NERVE CONDUCTION STUDY) WITH EMG (ELECTROMYOGRAPHY) REPORT   STUDY DATE: 11/07/23 PATIENT NAME: [name] DOB: 1961-03-22 MRN: [id number]  ORDERING CLINICIAN: Eduard Hanlon, MD   TECHNOLOGIST: CHRISTELLA Ahle ELECTROMYOGRAPHER: Eduard SAUNDERS. Haeven Nickle, MD  CLINICAL INFORMATION: 63 year old female with numbness and pain.  FINDINGS: NERVE CONDUCTION STUDY:  Left median, left ulnar, left peroneal and left tibial motor responses are normal.  Left ulnar and left tibial F wave latencies are normal.  Left median, left ulnar, left sural and left superficial peroneal sensory responses are normal.    NEEDLE ELECTROMYOGRAPHY:  Needle examination left lower extremity vastus medialis, tibialis anterior and gastrocnemius is normal.   IMPRESSION:   Normal study.  No electrodiagnostic evidence of large fiber neuropathy at this time.    INTERPRETING PHYSICIAN:  EDUARD FABIENE HANLON, MD Certified in Neurology, Neurophysiology and Neuroimaging  [facility] [address], [address] [place], [place] [address] [phone]  [facility]    Nerve / Sites Muscle Latency Ref. Amplitude Ref. Rel Amp Segments Distance Velocity Ref. Area    ms ms mV mV %  cm m/s m/s mVms  L Median - APB     Wrist APB 3.6 <=4.4 4.6 >=4.0 100 Wrist - APB 7   18.4     Upper arm APB 8.0  4.6  99.5 Upper arm - Wrist 24 55 >=49 18.4  L Ulnar - ADM     Wrist ADM 2.9 <=3.3 10.2 >=6.0 100 Wrist - ADM 7   40.7     B.Elbow ADM 4.8  8.3  81.6 B.Elbow - Wrist 12 64 >=49 36.7     A.Elbow ADM 7.6  8.9  107 A.Elbow - B.Elbow 16.6 59 >=49 39.3  L Peroneal - EDB     Ankle EDB 4.9 <=6.5 3.1 >=2.0 100 Ankle - EDB 9   14.8     Fib head EDB 10.3  3.2  103 Fib head - Ankle 25 46 >=44 15.3     Pop fossa EDB 13.1  2.9  90.5 Pop fossa - Fib head 13 47 >=44 13.8         Pop fossa - Ankle      L Tibial - AH     Ankle AH 5.0 <=5.8 13.7 >=4.0 100 Ankle - AH 9   35.8     Pop fossa AH 13.5   8.3  60.3 Pop fossa - Ankle 38 45 >=41 26.6             SNC    Nerve / Sites Rec. Site Peak Lat Ref.  Amp Ref. Segments Distance    ms ms V V  cm  L Sural - Ankle (Calf)     Calf Ankle 3.5 <=4.4 15 >=6 Calf - Ankle 14  L Superficial peroneal - Ankle     Lat leg Ankle 3.0 <=4.4 10 >=6 Lat leg - Ankle 14  L Median - Orthodromic (Dig II, Mid palm)     Dig II Wrist 3.3 <=3.4 32 >=10 Dig II - Wrist 13  L Ulnar - Orthodromic, (Dig V, Mid palm)     Dig V Wrist 3.0 <=3.1 13 >=5 Dig V - Wrist 2             F  Wave    Nerve F Lat Ref.   ms ms  L Tibial - AH 53.3 <=56.0  L Ulnar - ADM 27.7 <=32.0  EMG Summary Table    Spontaneous MUAP Recruitment  Muscle IA Fib PSW Fasc Other Amp Dur. Poly Pattern  L. Vastus medialis Normal None None None _______ Normal Normal Normal Normal  L. Tibialis anterior Normal None None None _______ Normal Normal Normal Normal  L. Gastrocnemius (Medial head) Normal None None None _______ Normal Normal Normal Normal

## 2023-11-08 NOTE — Telephone Encounter (Signed)
 Spoke with reading room. They are changing order to STAT.

## 2023-11-11 ENCOUNTER — Telehealth: Payer: Self-pay

## 2023-11-11 NOTE — Telephone Encounter (Signed)
 Copied from CRM [id number]. Topic: Clinical - Lab/Test Results >> Nov 11, 2023  2:43 PM Herbert Seta B wrote: Reason for CRM: Patient calling would like someone to go over imaging results from Lumbar spine CT, doesn't understand PCP notes in mychart.

## 2023-11-13 ENCOUNTER — Encounter: Payer: Self-pay | Admitting: Family Medicine

## 2023-11-17 ENCOUNTER — Ambulatory Visit
Admission: RE | Admit: 2023-11-17 | Discharge: 2023-11-17 | Disposition: A | Discharge status: Home / Self Care | Payer: Medicare Other | Source: Ambulatory Visit | Attending: Diagnostic Neuroimaging | Admitting: Diagnostic Neuroimaging

## 2023-11-17 DIAGNOSIS — R202 Paresthesia of skin: Secondary | ICD-10-CM | POA: Diagnosis not present

## 2023-11-17 DIAGNOSIS — R200 Anesthesia of skin: Secondary | ICD-10-CM | POA: Diagnosis not present

## 2023-11-17 MED ORDER — GADOPICLENOL 0.5 MMOL/ML IV SOLN
7.5000 mL | Freq: Once | INTRAVENOUS | Status: AC | PRN
  Administered 2023-11-17: 7.5 mL via INTRAVENOUS

## 2023-11-19 DIAGNOSIS — N200 Calculus of kidney: Secondary | ICD-10-CM | POA: Diagnosis not present

## 2023-11-25 NOTE — Progress Notes (Signed)
MRI scan looks good. No major findings. Continue current plan. -VRP

## 2023-12-03 ENCOUNTER — Ambulatory Visit: Payer: Medicare Other | Admitting: Diagnostic Neuroimaging

## 2023-12-04 ENCOUNTER — Ambulatory Visit: Payer: Self-pay | Admitting: Family Medicine

## 2023-12-04 ENCOUNTER — Ambulatory Visit: Payer: Medicare Other | Admitting: Family Medicine

## 2023-12-04 ENCOUNTER — Encounter: Payer: Self-pay | Admitting: Family Medicine

## 2023-12-04 VITALS — BP 122/78 | HR 61 | Temp 98.7°F | Resp 16 | Ht 68.0 in | Wt 155.4 lb

## 2023-12-04 DIAGNOSIS — H00015 Hordeolum externum left lower eyelid: Secondary | ICD-10-CM | POA: Diagnosis not present

## 2023-12-04 DIAGNOSIS — R21 Rash and other nonspecific skin eruption: Secondary | ICD-10-CM

## 2023-12-04 MED ORDER — ERYTHROMYCIN 5 MG/GM OP OINT
1.0000 | TOPICAL_OINTMENT | Freq: Every day | OPHTHALMIC | 0 refills | Status: DC
Start: 2023-12-04 — End: 2024-07-31

## 2023-12-04 MED ORDER — TRIAMCINOLONE ACETONIDE 0.1 % EX CREA
1.0000 | TOPICAL_CREAM | Freq: Two times a day (BID) | CUTANEOUS | 0 refills | Status: AC
Start: 2023-12-04 — End: ?

## 2023-12-04 NOTE — Telephone Encounter (Signed)
Copied from CRM [id number]. Topic: Clinical - Red Word Triage >> Dec 04, 2023  8:10 AM Shelbie Proctor wrote: Red Word that prompted transfer to Nurse Triage: Patient [phone] has a stye on left lower eyelid looks like yellow undertone, sore, spreading, painful to the touch. Patient feels like something is in the eye, can't see quite as good but not bad. Patient denies a fever, dizziness or light headed.   Chief Complaint: Patient states she  has a stye on left lower eyelid looks like yellow undertone, sore,  painful to the touch. Patient feels like something is in the eye, can't see quite as good but not bad. Patient denies a fever, dizziness or light headed.   Symptoms:  Left Lower swollen eyelid Frequency: x 5 days Pertinent Negatives: Patient denies fever at this time. Denis blurred vision. Disposition: [] ED /[] Urgent Care (no appt availability in office) / [x] Appointment(In office/virtual)/ []  [facility]/ [] Home Care/ [] Refused Recommended Disposition /[] [facility]/ []  Follow-up with PCP Additional Notes: Scheduled for today. Reason for Disposition  MODERATE eye pain or discomfort (e.g., interferes with normal activities or awakens from sleep; more than mild)  Answer Assessment - Initial Assessment Questions 1. ONSET: "When did the pain start?" (e.g., minutes, hours, days)     Started   on left Lower lid  5 days ago 2. TIMING: "Does the pain come and go, or has it been constant since it started?" (e.g., constant, intermittent, fleeting)      Touching it is very painful 3. SEVERITY: "How bad is the pain?"   (Scale 1-10; mild, moderate or severe)    - MODERATE (4-7): interferes with normal activities or awakens from sleep       Rates 6 out of 10  4. LOCATION: "Where does it hurt?"  (e.g., eyelid, eye, cheekbone)      Left  lower eyelid - little pimple on lower lid.  5. CAUSE: "What do you think is causing the pain?"      Stye on Left lower eyelid 6. VISION: "Do  you have blurred vision or changes in your vision?"       7. EYE DISCHARGE: "Is there any discharge (pus) from the eye(s)?"  If Yes, ask: "What color is it?"       Very slight in the left corner  - yellow in color 8. FEVER: "Do you have a fever?" If Yes, ask: "What is it, how was it measured, and when did it start?"      Denies 9. OTHER SYMPTOMS: "Do you have any other symptoms?" (e.g., headache, nasal discharge, facial rash)     Nasal discharge. She is getting over cold. This morning hurts to move eyes from left o right. Thinks it may be . Rash  on body x 3 week. 10. PREGNANCY: "Is there any chance you are pregnant?" "When was your last menstrual period?"       N/a  Protocols used: Eye Pain and Other Symptoms-A-AH

## 2023-12-04 NOTE — Patient Instructions (Signed)
Continue warm compresses and massage towards the lashes.  Continue artificial tears.   Try not to scratch as this can make things worse. Avoid scented products while dealing with this. You may resume when the itchiness resolves. Cold/cool compresses can help.   If no improvement in the rash in the next 2-3 weeks, follow up with Dr. Laury Axon to discuss a biopsy.   Let us know if you need anything.

## 2023-12-04 NOTE — Progress Notes (Signed)
Chief Complaint  Patient presents with   Eye Problem    X5 days, lower left eyelid, Pt states having pain and swelling.     [name] is here for left eye irritation.  Duration: 5 days Chemical exposure? No  Recent URI? Getting over a cough Drainage? No Vision loss? No Trauma? No Treatment to date: Warm compresses, artificial tears  Rash on abd for the past mo. Sometimes itchy. No new topicals. Does use scented products though. No illness leading into the rash. Not spreading. No sick contacts.   Past Medical History:  Diagnosis Date   A-fib [facility])    Allergy    Anxiety    Arthritis    Cataract    Depression    Family History  Problem Relation Age of Onset   Multiple myeloma Father    Cancer Father 21       multiple myeloma   Ovarian cancer Mother    Emphysema Mother    COPD Mother    Hypertension Mother    Obesity Mother    Depression Mother    Diabetes Mother    Cancer Mother 49       ovarian   Anxiety disorder Other    Arthritis Brother     BP 122/78 (BP Location: Left Arm, Patient Position: Sitting, Cuff Size: Normal)   Pulse 61   Temp 98.7 F (37.1 C) (Oral)   Resp 16   Ht 5\' 8"  (1.727 m)   Wt 155 lb 6.4 oz (70.5 kg)   SpO2 99%   BMI 23.63 kg/m  Gen: Awake, alert, appears stated age Eyes: Lids neg on R and upper L; lower L with slight edema and erythema surrounding a pustule on conjunctiva, Sclera white, PERRLA, EOMi Nose: Nares patent without discharge Mouth: MMM, pharynx without erythema or exudate Skin: Pink papules and excoriated lesions over lower torso without fluctuance, erythema, excessive warmth, ttp Psych: Age appropriate judgment and insight; mood and affect normal  Hordeolum externum of left lower eyelid - Plan: erythromycin ophthalmic ointment  Rash - Plan: triamcinolone cream (KENALOG) 0.1 %  Cont warm compresses and artificial tears. Erythromycin ointment.  F/u if no improvement in 5-7 days. Avoid scented products. Try not to  scratch. Kenalog prn. F/u if no improvement, consider bx.  Pt voiced understanding and agreement to the plan.  [name] [place], DO 12/04/23 4:50 PM

## 2023-12-20 ENCOUNTER — Telehealth: Payer: Self-pay | Admitting: Family Medicine

## 2023-12-20 NOTE — Telephone Encounter (Signed)
Copied from CRM [id number]. Topic: Medicare AWV >> Dec 20, 2023 11:10 AM Payton Doughty wrote: Reason for CRM: Called LVM 12/20/2023 to schedule AWV. Please schedule Virtual or Telehealth visits ONLY.   Verlee Rossetti; Care Guide Ambulatory Clinical Support [facility] Direct Dial: [phone]

## 2023-12-24 ENCOUNTER — Ambulatory Visit: Payer: Medicare Other | Admitting: Family Medicine

## 2023-12-27 DIAGNOSIS — N200 Calculus of kidney: Secondary | ICD-10-CM | POA: Diagnosis not present

## 2024-02-08 ENCOUNTER — Other Ambulatory Visit: Payer: Self-pay | Admitting: Family Medicine

## 2024-02-08 DIAGNOSIS — F419 Anxiety disorder, unspecified: Secondary | ICD-10-CM

## 2024-02-11 ENCOUNTER — Other Ambulatory Visit (HOSPITAL_BASED_OUTPATIENT_CLINIC_OR_DEPARTMENT_OTHER): Payer: Self-pay | Admitting: Family Medicine

## 2024-02-11 DIAGNOSIS — Z1231 Encounter for screening mammogram for malignant neoplasm of breast: Secondary | ICD-10-CM

## 2024-02-18 ENCOUNTER — Ambulatory Visit (HOSPITAL_BASED_OUTPATIENT_CLINIC_OR_DEPARTMENT_OTHER)
Admission: RE | Admit: 2024-02-18 | Discharge: 2024-02-18 | Disposition: A | Discharge status: Home / Self Care | Source: Ambulatory Visit | Attending: Family Medicine | Admitting: Family Medicine

## 2024-02-18 DIAGNOSIS — Z1231 Encounter for screening mammogram for malignant neoplasm of breast: Secondary | ICD-10-CM | POA: Diagnosis not present

## 2024-04-21 ENCOUNTER — Ambulatory Visit (INDEPENDENT_AMBULATORY_CARE_PROVIDER_SITE_OTHER)

## 2024-04-21 VITALS — Ht 68.0 in | Wt 155.0 lb

## 2024-04-21 DIAGNOSIS — N200 Calculus of kidney: Secondary | ICD-10-CM | POA: Diagnosis not present

## 2024-04-21 DIAGNOSIS — Z0000 Encounter for general adult medical examination without abnormal findings: Secondary | ICD-10-CM

## 2024-04-21 NOTE — Patient Instructions (Addendum)
 Ms. Keitt , Thank you for taking time out of your busy schedule to complete your Annual Wellness Visit with me. I enjoyed our conversation and look forward to speaking with you again next year. I, as well as your care team,  appreciate your ongoing commitment to your health goals. Please review the following plan we discussed and let me know if I can assist you in the future. Your Game plan/ To Do List    Follow up Visits: Next Medicare AWV with our clinical staff: In 1 year    Have you seen your provider in the last 6 months (3 months if uncontrolled diabetes)? Yes Next Office Visit with your provider: 08/25/24 @ 1:00  Clinician Recommendations:  Aim for 30 minutes of exercise or brisk walking, 6-8 glasses of water, and 5 servings of fruits and vegetables each day.       This is a list of the screening recommended for you and due dates:  Health Maintenance  Topic Date Due   COVID-19 Vaccine (1) Never done   HIV Screening  Never done   Pap with HPV screening  04/19/2013   Flu Shot  06/05/2024   Medicare Annual Wellness Visit  04/21/2025   Mammogram  02/17/2026   DTaP/Tdap/Td vaccine (3 - Td or Tdap) 08/18/2032   Hepatitis C Screening  Completed   Zoster (Shingles) Vaccine  Completed   HPV Vaccine  Aged Out   Meningitis B Vaccine  Aged Out   Colon Cancer Screening  Discontinued    Advanced directives: (ACP Link)Information on Advanced Care Planning can be found at [place]  Secretary of [facility] Advance Health Care Directives Advance Health Care Directives. [email or web address]   Advance Care Planning is important because it:  [x]  Makes sure you receive the medical care that is consistent with your values, goals, and preferences  [x]  It provides guidance to your family and loved ones and reduces their decisional burden about whether or not they are making the right decisions based on your wishes.  Follow the link provided in your after visit summary or read over the paperwork we have mailed  to you to help you started getting your Advance Directives in place. If you need assistance in completing these, please reach out to us  so that we can help you!  See attachments for Preventive Care and Fall Prevention Tips.

## 2024-04-21 NOTE — Progress Notes (Signed)
 Subjective:   [name] is a 63 y.o. who presents for a Medicare Wellness preventive visit.  As a reminder, Annual Wellness Visits don't include a physical exam, and some assessments may be limited, especially if this visit is performed virtually. We may recommend an in-person follow-up visit with your provider if needed.  Visit Complete: Virtual I connected with  Drishti C Nanney on 04/21/24 by a audio enabled telemedicine application and verified that I am speaking with the correct person using two identifiers.  Patient Location: Home  Provider Location: Home Office  I discussed the limitations of evaluation and management by telemedicine. The patient expressed understanding and agreed to proceed.  Vital Signs: Because this visit was a virtual/telehealth visit, some criteria may be missing or patient reported. Any vitals not documented were not able to be obtained and vitals that have been documented are patient reported.  VideoDeclined- This patient declined Librarian, academic. Therefore the visit was completed with audio only.  Persons Participating in Visit: Patient.  AWV Questionnaire: No: Patient Medicare AWV questionnaire was not completed prior to this visit.  Cardiac Risk Factors include: none     Objective:    Today's Vitals   04/21/24 1326  Weight: 155 lb (70.3 kg)  Height: 5' 8 (1.727 m)   Body mass index is 23.57 kg/m.     04/21/2024    1:33 PM 09/04/2023    2:09 PM 01/30/2023    9:40 AM 01/16/2022    1:11 PM  Advanced Directives  Does Patient Have a Medical Advance Directive? No No No No  Would patient like information on creating a medical advance directive? Yes (MAU/Ambulatory/Procedural Areas - Information given) Yes (MAU/Ambulatory/Procedural Areas - Information given) No - Patient declined No - Patient declined    Current Medications (verified) Outpatient Encounter Medications as of 04/21/2024  Medication Sig   apixaban  (ELIQUIS) 5 MG TABS tablet Take by mouth.   ciclopirox  (PENLAC ) 8 % solution Apply topically at bedtime. Apply over nail and surrounding skin. Apply daily over previous coat. After seven (7) days, may remove with alcohol and continue cycle.   clonazePAM  (KLONOPIN ) 1 MG tablet 1 po qhs prn   erythromycin  ophthalmic ointment Place 1 Application into the left eye at bedtime.   escitalopram  (LEXAPRO ) 20 MG tablet TAKE 1 TABLET BY MOUTH EVERY DAY   sodium bicarbonate 650 MG tablet Take 650 mg by mouth 2 (two) times daily.   triamcinolone  cream (KENALOG ) 0.1 % Apply 1 Application topically 2 (two) times daily.   No facility-administered encounter medications on file as of 04/21/2024.    Allergies (verified) Estradiol, Codeine, Hydrocodone-acetaminophen, and Ciprofloxacin hcl   History: Past Medical History:  Diagnosis Date   A-fib [facility])    Allergy    Anxiety    Arthritis    Cataract    Depression    Past Surgical History:  Procedure Laterality Date   BRAIN SURGERY  06/2005   Augmentation    COLON SURGERY  1998   Removal   COSMETIC SURGERY  2006   FRACTURE SURGERY  2010   iliostemy  03/2007   KIDNEY STONE SURGERY N/A    DR Delsa Fife-- [facility]  urology   SHOULDER SURGERY Left    WRIST SURGERY Left    Family History  Problem Relation Age of Onset   Multiple myeloma Father    Cancer Father 72       multiple myeloma   Ovarian cancer Mother  Emphysema Mother    COPD Mother    Hypertension Mother    Obesity Mother    Depression Mother    Diabetes Mother    Cancer Mother 30       ovarian   Arthritis Brother    Anxiety disorder Other    Social History   Socioeconomic History   Marital status: Married    Spouse name: Not on file   Number of children: Not on file   Years of education: Not on file   Highest education level: Associate degree: occupational, Scientist, product/process development, or vocational program  Occupational History   Occupation: not working    Associate Professor: [employer]    Occupation: Tax inspector     Comment: on disability  Tobacco Use   Smoking status: Never   Smokeless tobacco: Never  Substance and Sexual Activity   Alcohol use: No   Drug use: No   Sexual activity: Not Currently    Partners: Male  Other Topics Concern   Not on file  Social History Narrative   Exercise--- no    Social Drivers of Health   Financial Resource Strain: Low Risk  (04/21/2024)   Overall Financial Resource Strain (CARDIA)    Difficulty of Paying Living Expenses: Not hard at all  Food Insecurity: No Food Insecurity (04/21/2024)   Hunger Vital Sign    Worried About Running Out of Food in the Last Year: Never true    Ran Out of Food in the Last Year: Never true  Transportation Needs: No Transportation Needs (04/21/2024)   PRAPARE - Administrator, Civil Service (Medical): No    Lack of Transportation (Non-Medical): No  Physical Activity: Insufficiently Active (04/21/2024)   Exercise Vital Sign    Days of Exercise per Week: 2 days    Minutes of Exercise per Session: 30 min  Stress: No Stress Concern Present (04/21/2024)   [employer] of Occupational Health - Occupational Stress Questionnaire    Feeling of Stress: Not at all  Social Connections: Moderately Isolated (04/21/2024)   Social Connection and Isolation Panel    Frequency of Communication with Friends and Family: More than three times a week    Frequency of Social Gatherings with Friends and Family: Twice a week    Attends Religious Services: Patient declined    Database administrator or Organizations: No    Attends Engineer, structural: Never    Marital Status: Married    Tobacco Counseling Counseling given: Not Answered    Clinical Intake:  Pre-visit preparation completed: Yes  Pain : No/denies pain  Diabetes: No  Lab Results  Component Value Date   HGBA1C 5.5 10/13/2010   HGBA1C 5.4 10/07/2009     How often do you need to have someone help you when you read  instructions, pamphlets, or other written materials from your doctor or pharmacy?: 1 - Never  Interpreter Needed?: No  Information entered by :: Seabron Cypress LPN   Activities of Daily Living     04/21/2024    1:33 PM  In your present state of health, do you have any difficulty performing the following activities:  Hearing? 0  Vision? 0  Difficulty concentrating or making decisions? 0  Walking or climbing stairs? 0  Dressing or bathing? 0  Doing errands, shopping? 0  Preparing Food and eating ? N  Using the Toilet? N  In the past six months, have you accidently leaked urine? N  Do you have problems with loss of  bowel control? N  Managing your Medications? N  Managing your Finances? N  Housekeeping or managing your Housekeeping? N    Patient Care Team: Crecencio Dodge, Candida Chalk, DO as PCP - General Pegge Bow, NP as Nurse Practitioner (Obstetrics and Gynecology) Merdis Stalling, MD as Referring Physician (Cardiology) Stephan Edwards, MD (Urology) Salli Crawley Brenton Cambridge, MD as Consulting Physician (Neurology) [facility]    I have updated your Care Teams any recent Medical Services you may have received from other providers in the past year.     Assessment:   This is a routine wellness examination for Alonnie.  Hearing/Vision screen Hearing Screening - Comments:: Denies hearing difficulties    Vision Screening - Comments:: Wears rx glasses - up to date with routine eye exams with [facility] [place])    Goals Addressed             This Visit's Progress    Maintain health and independence   On track      Depression Screen     04/21/2024    1:31 PM 08/19/2023   10:54 AM 01/30/2023   10:03 AM 01/16/2022    1:10 PM 08/15/2021    9:47 AM  PHQ 2/9 Scores  PHQ - 2 Score 0 0 0 0 0    Fall Risk     04/21/2024    1:33 PM 01/29/2023    2:55 PM 01/11/2023    9:31 AM 01/16/2022    1:10 PM 08/15/2021    9:47 AM  Fall Risk   Falls in  the past year? 0 1 1 0 0  Number falls in past yr: 0 0 0 0 0  Injury with Fall? 0 0 0 0 0  Risk for fall due to : No Fall Risks History of fall(s)  No Fall Risks   Follow up Falls prevention discussed;Education provided;Falls evaluation completed Falls evaluation completed  Falls evaluation completed  Falls evaluation completed      Data saved with a previous flowsheet row definition    MEDICARE RISK AT HOME:  Medicare Risk at Home Any stairs in or around the home?: No If so, are there any without handrails?: No Home free of loose throw rugs in walkways, pet beds, electrical cords, etc?: Yes Adequate lighting in your home to reduce risk of falls?: Yes Life alert?: No Use of a cane, walker or w/c?: No Grab bars in the bathroom?: Yes Shower chair or bench in shower?: No Elevated toilet seat or a handicapped toilet?: Yes  TIMED UP AND GO:  Was the test performed?  No  Cognitive Function: 6CIT completed        04/21/2024    1:33 PM 01/30/2023    9:50 AM 01/16/2022    1:15 PM  6CIT Screen  What Year? 0 points 0 points 0 points  What month? 0 points 0 points 0 points  What time? 0 points 0 points 0 points  Count back from 20 0 points 0 points 0 points  Months in reverse 0 points 0 points 0 points  Repeat phrase 0 points 0 points 0 points  Total Score 0 points 0 points 0 points    Immunizations Immunization History  Administered Date(s) Administered   Influenza Split 08/22/2013   Td 12/16/2002   Tdap 08/18/2022   Zoster Recombinant(Shingrix) 08/17/2021, 01/16/2022    Screening Tests Health Maintenance  Topic Date Due   COVID-19 Vaccine (1) Never done   HIV Screening  Never done  Cervical Cancer Screening (HPV/Pap Cotest)  04/19/2013   INFLUENZA VACCINE  06/05/2024   Medicare Annual Wellness (AWV)  04/21/2025   MAMMOGRAM  02/17/2026   DTaP/Tdap/Td (3 - Td or Tdap) 08/18/2032   Hepatitis C Screening  Completed   Zoster Vaccines- Shingrix  Completed   HPV VACCINES   Aged Out   Meningococcal B Vaccine  Aged Out   Colonoscopy  Discontinued    Health Maintenance  Health Maintenance Due  Topic Date Due   COVID-19 Vaccine (1) Never done   HIV Screening  Never done   Cervical Cancer Screening (HPV/Pap Cotest)  04/19/2013    Additional Screening:  Vision Screening: Recommended annual ophthalmology exams for early detection of glaucoma and other disorders of the eye. Would you like a referral to an eye doctor? No    Dental Screening: Recommended annual dental exams for proper oral hygiene  Community Resource Referral / Chronic Care Management: CRR required this visit?  No   CCM required this visit?  No   Plan:    I have personally reviewed and noted the following in the patient's chart:   Medical and social history Use of alcohol, tobacco or illicit drugs  Current medications and supplements including opioid prescriptions. Patient is not currently taking opioid prescriptions. Functional ability and status Nutritional status Physical activity Advanced directives List of other physicians Hospitalizations, surgeries, and ER visits in previous 12 months Vitals Screenings to include cognitive, depression, and falls Referrals and appointments  In addition, I have reviewed and discussed with patient certain preventive protocols, quality metrics, and best practice recommendations. A written personalized care plan for preventive services as well as general preventive health recommendations were provided to patient.   Seabron Cypress [place], [place]   1/61/0960   After Visit Summary: (MyChart) Due to this being a telephonic visit, the after visit summary with patients personalized plan was offered to patient via MyChart   Notes: Nothing significant to report at this time.

## 2024-05-25 DIAGNOSIS — L820 Inflamed seborrheic keratosis: Secondary | ICD-10-CM | POA: Diagnosis not present

## 2024-05-25 DIAGNOSIS — D224 Melanocytic nevi of scalp and neck: Secondary | ICD-10-CM | POA: Diagnosis not present

## 2024-05-25 DIAGNOSIS — L815 Leukoderma, not elsewhere classified: Secondary | ICD-10-CM | POA: Diagnosis not present

## 2024-05-25 DIAGNOSIS — Z7189 Other specified counseling: Secondary | ICD-10-CM | POA: Diagnosis not present

## 2024-05-25 DIAGNOSIS — D2239 Melanocytic nevi of other parts of face: Secondary | ICD-10-CM | POA: Diagnosis not present

## 2024-07-29 DIAGNOSIS — L710 Perioral dermatitis: Secondary | ICD-10-CM | POA: Diagnosis not present

## 2024-07-31 ENCOUNTER — Ambulatory Visit: Admitting: Family Medicine

## 2024-07-31 ENCOUNTER — Telehealth: Payer: Self-pay

## 2024-07-31 ENCOUNTER — Encounter: Payer: Self-pay | Admitting: Family Medicine

## 2024-07-31 ENCOUNTER — Ambulatory Visit: Payer: Self-pay

## 2024-07-31 VITALS — BP 120/74 | HR 66 | Temp 98.2°F | Resp 18 | Ht 68.0 in | Wt 154.0 lb

## 2024-07-31 DIAGNOSIS — M542 Cervicalgia: Secondary | ICD-10-CM

## 2024-07-31 MED ORDER — METHYLPREDNISOLONE 4 MG PO TBPK
ORAL_TABLET | ORAL | 0 refills | Status: DC
Start: 2024-07-31 — End: 2024-07-31

## 2024-07-31 MED ORDER — METHOCARBAMOL 500 MG PO TABS
500.0000 mg | ORAL_TABLET | Freq: Three times a day (TID) | ORAL | 0 refills | Status: DC | PRN
Start: 2024-07-31 — End: 2024-08-25

## 2024-07-31 MED ORDER — METHYLPREDNISOLONE 4 MG PO TBPK
ORAL_TABLET | ORAL | 0 refills | Status: DC
Start: 2024-07-31 — End: 2024-08-25

## 2024-07-31 NOTE — Telephone Encounter (Signed)
 Spoke with [facility] stated system went down , resent script

## 2024-07-31 NOTE — Telephone Encounter (Signed)
 Copied from CRM #[id number]. Topic: Clinical - Prescription Issue >> Jul 31, 2024 12:37 PM [place] E wrote: Reason for CRM: Patient was prescribed a new medication today, methylPREDNISolone  (MEDROL  DOSEPAK) 4 MG TBPK tablet, but the pharmacy told patient that it was too soon to fill, when patient has never even been on this medication before and the pharmacy confirmed the receipt today 9/26.

## 2024-07-31 NOTE — Telephone Encounter (Signed)
 FYI Only or Action Required?: FYI only for provider.  Patient was last seen in primary care on 12/04/2023 by Frann Mabel Mt, DO.  Called Nurse Triage reporting Neck Pain.  Symptoms began several days ago.  Interventions attempted: Rest, hydration, or home remedies. Warm compress.  Symptoms are: unchanged.  Triage Disposition: See PCP When Office is Open (Within 3 Days)  Patient/caregiver understands and will follow disposition?: Yes                   Copied from CRM [id number]. Topic: Clinical - Red Word Triage >> Jul 31, 2024  8:05 AM Larissa RAMAN wrote: [facility] that prompted transfer to Nurse Triage: neck pain and headache x 3 days Reason for Disposition  [1] MODERATE neck pain (e.g., interferes with normal activities) AND [2] present > 3 days  Answer Assessment - Initial Assessment Questions 1. ONSET: When did the pain begin?      3 days 2. LOCATION: Where does it hurt?      Neck  - was on left side, now on other side 3. PATTERN Does the pain come and go, or has it been constant since it started?      constant 4. SEVERITY: How bad is the pain?  (Scale 0-10; or none or slight stiffness, mild, moderate, severe)     5/10 now 5. RADIATION: Does the pain go anywhere else, shoot into your arms?     no 6. CORD SYMPTOMS: Any weakness or numbness of the arms or legs?     no 7. CAUSE: What do you think is causing the neck pain?     unknown 8. NECK OVERUSE: Any recent activities that involved turning or twisting the neck?     no 9. OTHER SYMPTOMS: Do you have any other symptoms? (e.g., headache, fever, chest pain, difficulty breathing, neck swelling)     HA, low energy  Protocols used: Neck Pain or Stiffness-A-AH

## 2024-07-31 NOTE — Patient Instructions (Signed)
Heat (pad or rice pillow in microwave) over affected area, 10-15 minutes twice daily.   Ice/cold pack over area for 10-15 min twice daily.  OK to take Tylenol 1000 mg (2 extra strength tabs) or 975 mg (3 regular strength tabs) every 6 hours as needed.  Let us know if you need anything.  EXERCISES RANGE OF MOTION (ROM) AND STRETCHING EXERCISES  These exercises may help you when beginning to rehabilitate your issue. In order to successfully resolve your symptoms, you must improve your posture. These exercises are designed to help reduce the forward-head and rounded-shoulder posture which contributes to this condition. Your symptoms may resolve with or without further involvement from your physician, physical therapist or athletic trainer. While completing these exercises, remember:  Restoring tissue flexibility helps normal motion to return to the joints. This allows healthier, less painful movement and activity. An effective stretch should be held for at least 20 seconds, although you may need to begin with shorter hold times for comfort. A stretch should never be painful. You should only feel a gentle lengthening or release in the stretched tissue. Do not do any stretch or exercise that you cannot tolerate.  STRETCH- Axial Extensors Lie on your back on the floor. You may bend your knees for comfort. Place a rolled-up hand towel or dish towel, about 2 inches in diameter, under the part of your head that makes contact with the floor. Gently tuck your chin, as if trying to make a "double chin," until you feel a gentle stretch at the base of your head. Hold 15-20 seconds. Repeat 2-3 times. Complete this exercise 1 time per day.   STRETCH - Axial Extension  Stand or sit on a firm surface. Assume a good posture: chest up, shoulders drawn back, abdominal muscles slightly tense, knees unlocked (if standing) and feet hip width apart. Slowly retract your chin so your head slides back and your chin  slightly lowers. Continue to look straight ahead. You should feel a gentle stretch in the back of your head. Be certain not to feel an aggressive stretch since this can cause headaches later. Hold for 15-20 seconds. Repeat 2-3 times. Complete this exercise 1 time per day.  STRETCH - Cervical Side Bend  Stand or sit on a firm surface. Assume a good posture: chest up, shoulders drawn back, abdominal muscles slightly tense, knees unlocked (if standing) and feet hip width apart. Without letting your nose or shoulders move, slowly tip your right / left ear to your shoulder until your feel a gentle stretch in the muscles on the opposite side of your neck. Hold 15-20 seconds. Repeat 2-3 times. Complete this exercise 1-2 times per day.  STRETCH - Cervical Rotators  Stand or sit on a firm surface. Assume a good posture: chest up, shoulders drawn back, abdominal muscles slightly tense, knees unlocked (if standing) and feet hip width apart. Keeping your eyes level with the ground, slowly turn your head until you feel a gentle stretch along the back and opposite side of your neck. Hold 15-20 seconds. Repeat 2-3 times. Complete this exercise 1-2 times per day.  RANGE OF MOTION - Neck Circles  Stand or sit on a firm surface. Assume a good posture: chest up, shoulders drawn back, abdominal muscles slightly tense, knees unlocked (if standing) and feet hip width apart. Gently roll your head down and around from the back of one shoulder to the back of the other. The motion should never be forced or painful. Repeat the motion   10-20 times, or until you feel the neck muscles relax and loosen. Repeat 2-3 times. Complete the exercise 1-2 times per day. STRENGTHENING EXERCISES - Cervical Strain and Sprain These exercises may help you when beginning to rehabilitate your injury. They may resolve your symptoms with or without further involvement from your physician, physical therapist, or athletic trainer. While  completing these exercises, remember:  Muscles can gain both the endurance and the strength needed for everyday activities through controlled exercises. Complete these exercises as instructed by your physician, physical therapist, or athletic trainer. Progress the resistance and repetitions only as guided. You may experience muscle soreness or fatigue, but the pain or discomfort you are trying to eliminate should never worsen during these exercises. If this pain does worsen, stop and make certain you are following the directions exactly. If the pain is still present after adjustments, discontinue the exercise until you can discuss the trouble with your clinician.  STRENGTH - Cervical Flexors, Isometric Face a wall, standing about 6 inches away. Place a small pillow, a ball about 6-8 inches in diameter, or a folded towel between your forehead and the wall. Slightly tuck your chin and gently push your forehead into the soft object. Push only with mild to moderate intensity, building up tension gradually. Keep your jaw and forehead relaxed. Hold 10 to 20 seconds. Keep your breathing relaxed. Release the tension slowly. Relax your neck muscles completely before you start the next repetition. Repeat 2-3 times. Complete this exercise 1 time per day.  STRENGTH- Cervical Lateral Flexors, Isometric  Stand about 6 inches away from a wall. Place a small pillow, a ball about 6-8 inches in diameter, or a folded towel between the side of your head and the wall. Slightly tuck your chin and gently tilt your head into the soft object. Push only with mild to moderate intensity, building up tension gradually. Keep your jaw and forehead relaxed. Hold 10 to 20 seconds. Keep your breathing relaxed. Release the tension slowly. Relax your neck muscles completely before you start the next repetition. Repeat 2-3 times. Complete this exercise 1 time per day.  STRENGTH - Cervical Extensors, Isometric  Stand about 6 inches  away from a wall. Place a small pillow, a ball about 6-8 inches in diameter, or a folded towel between the back of your head and the wall. Slightly tuck your chin and gently tilt your head back into the soft object. Push only with mild to moderate intensity, building up tension gradually. Keep your jaw and forehead relaxed. Hold 10 to 20 seconds. Keep your breathing relaxed. Release the tension slowly. Relax your neck muscles completely before you start the next repetition. Repeat 2-3 times. Complete this exercise 1 time per day.  POSTURE AND BODY MECHANICS CONSIDERATIONS Keeping correct posture when sitting, standing or completing your activities will reduce the stress put on different body tissues, allowing injured tissues a chance to heal and limiting painful experiences. The following are general guidelines for improved posture. Your physician or physical therapist will provide you with any instructions specific to your needs. While reading these guidelines, remember: The exercises prescribed by your provider will help you have the flexibility and strength to maintain correct postures. The correct posture provides the optimal environment for your joints to work. All of your joints have less wear and tear when properly supported by a spine with good posture. This means you will experience a healthier, less painful body. Correct posture must be practiced with all of your activities, especially   prolonged sitting and standing. Correct posture is as important when doing repetitive low-stress activities (typing) as it is when doing a single heavy-load activity (lifting).  PROLONGED STANDING WHILE SLIGHTLY LEANING FORWARD When completing a task that requires you to lean forward while standing in one place for a long time, place either foot up on a stationary 2- to 4-inch high object to help maintain the best posture. When both feet are on the ground, the low back tends to lose its slight inward curve. If  this curve flattens (or becomes too large), then the back and your other joints will experience too much stress, fatigue more quickly, and can cause pain.   RESTING POSITIONS Consider which positions are most painful for you when choosing a resting position. If you have pain with flexion-based activities (sitting, bending, stooping, squatting), choose a position that allows you to rest in a less flexed posture. You would want to avoid curling into a fetal position on your side. If your pain worsens with extension-based activities (prolonged standing, working overhead), avoid resting in an extended position such as sleeping on your stomach. Most people will find more comfort when they rest with their spine in a more neutral position, neither too rounded nor too arched. Lying on a non-sagging bed on your side with a pillow between your knees, or on your back with a pillow under your knees will often provide some relief. Keep in mind, being in any one position for a prolonged period of time, no matter how correct your posture, can still lead to stiffness.  WALKING Walk with an upright posture. Your ears, shoulders, and hips should all line up. OFFICE WORK When working at a desk, create an environment that supports good, upright posture. Without extra support, muscles fatigue and lead to excessive strain on joints and other tissues.  CHAIR: A chair should be able to slide under your desk when your back makes contact with the back of the chair. This allows you to work closely. The chair's height should allow your eyes to be level with the upper part of your monitor and your hands to be slightly lower than your elbows. Body position: Your feet should make contact with the floor. If this is not possible, use a foot rest. Keep your ears over your shoulders. This will reduce stress on your neck and low back.  

## 2024-07-31 NOTE — Progress Notes (Signed)
 Musculoskeletal Exam  Patient: [name] DOB: 09/13/1961  DOS: 07/31/2024  SUBJECTIVE:  Chief Complaint:   Chief Complaint  Patient presents with   Neck Pain    Onset: 3 days    [name] is a 63 y.o.  female for evaluation and treatment of neck pain.   Onset:  3 days ago. No inj or change in activity.  Location: started on L posterior neck and no wb/l posterior neck Character:  aching and sharp  Progression of issue:  slightly worsened Associated symptoms: decreased Rom, HA No bruising, redness, swelling, fevers. Treatment: to date has been rest, ice, acetaminophen, and heat.   Neurovascular symptoms: no  Past Medical History:  Diagnosis Date   A-fib (HCC)    Allergy    Anxiety    Arthritis    Cataract    Depression     Objective: VITAL SIGNS: BP 120/74 (BP Location: Left Arm, Cuff Size: Normal)   Pulse 66   Temp 98.2 F (36.8 C)   Resp 18   Ht 5' 8 (1.727 m)   Wt 154 lb (69.9 kg)   SpO2 98%   BMI 23.42 kg/m  Constitutional: Well formed, well developed. No acute distress. Thorax & Lungs: No accessory muscle use Musculoskeletal: Neck.   Normal active range of motion: no.  Limited rotation and extension Normal passive range of motion: no Tenderness to palpation: Yes; over subocc triangle, lateral neck, cerv parasp msc on R Deformity: no Ecchymosis: no Tests positive: none Tests negative: Spurling's, Kernig's Neurologic: Normal sensory function. No focal deficits noted. DTR's equal and symmetric in UE's. No clonus. 5/5 strength in UE's b/l. Psychiatric: Normal mood. Age appropriate judgment and insight. Alert & oriented x 3.    Assessment:  Acute neck pain - Plan: methocarbamol  (ROBAXIN ) 500 MG tablet, methylPREDNISolone  (MEDROL  DOSEPAK) 4 MG TBPK tablet  Plan: Stretches/exercises, heat, ice, Tylenol. Consider PT vs sports med if no improvement. Low risk of neurologic or infectious etiology.  F/u prn. The patient voiced understanding and agreement  to the plan.   Mabel [place], DO 07/31/24  11:52 AM

## 2024-07-31 NOTE — Addendum Note (Signed)
 Addended by: DORLENE CHIQUITA RAMAN on: 07/31/2024 01:03 PM   Modules accepted: Orders

## 2024-08-13 ENCOUNTER — Other Ambulatory Visit: Payer: Self-pay | Admitting: Family Medicine

## 2024-08-13 DIAGNOSIS — F419 Anxiety disorder, unspecified: Secondary | ICD-10-CM

## 2024-08-25 ENCOUNTER — Ambulatory Visit (INDEPENDENT_AMBULATORY_CARE_PROVIDER_SITE_OTHER): Payer: Medicare Other | Admitting: Family Medicine

## 2024-08-25 ENCOUNTER — Encounter: Payer: Self-pay | Admitting: Family Medicine

## 2024-08-25 VITALS — BP 120/88 | HR 50 | Temp 97.9°F | Resp 16 | Ht 68.0 in | Wt 157.4 lb

## 2024-08-25 DIAGNOSIS — F419 Anxiety disorder, unspecified: Secondary | ICD-10-CM | POA: Diagnosis not present

## 2024-08-25 DIAGNOSIS — E2839 Other primary ovarian failure: Secondary | ICD-10-CM

## 2024-08-25 DIAGNOSIS — B351 Tinea unguium: Secondary | ICD-10-CM

## 2024-08-25 DIAGNOSIS — Z0000 Encounter for general adult medical examination without abnormal findings: Secondary | ICD-10-CM | POA: Diagnosis not present

## 2024-08-25 MED ORDER — ESCITALOPRAM OXALATE 20 MG PO TABS
20.0000 mg | ORAL_TABLET | Freq: Every day | ORAL | 3 refills | Status: AC
Start: 2024-08-25 — End: ?

## 2024-08-25 MED ORDER — CICLOPIROX 8 % EX SOLN
Freq: Every day | CUTANEOUS | 1 refills | Status: AC
Start: 2024-08-25 — End: ?

## 2024-08-25 NOTE — Progress Notes (Signed)
 +  Subjective:    Patient ID: [name], female    DOB: 1961/01/22, 63 y.o.   MRN: [id number]  Chief Complaint  Patient presents with   Annual Exam    Pt states fasting     HPI Patient is in today for CPE.   Discussed the use of AI scribe software for clinical note transcription with the patient, who gave verbal consent to proceed.  History of Present Illness [name] is a 63 year old female who presents for an annual physical exam and medication refills.  She is here to ensure she has refills for her medications, specifically Lexapro  and Penlac . She believes she is up to date on her Lexapro  refills as they were recently called in last month.  She has a history of an adverse reaction to the shingles vaccine, which led to a year of leg pain and burning feet. Initially, there was a concern for Guillain-Barre syndrome, but this was ruled out. She continues to experience some burning in her legs and feet but notes improvement over time.  She is currently under the care of several specialists: Dr. Merilee for atrial fibrillation, Dr. Debora for kidney stones, and Dr. Orrie for neurology. She also sees Shona Fireman for gynecological care.  She has been engaging in regular physical activity, walking three miles a day, five to six days a week, for the past three months. Despite this, she has noticed some weight gain, which she attributes to not removing her shoes during weigh-ins.  She experiences occasional feelings of disconnection and reduced energy, which she attributes to aging. Sexual activity is not enjoyable, and she has had an allergic reaction to a prescribed cream for this condition.  Her husband, Deatrice, was recently laid off but received a Customer service manager and is currently on [employer]. She is exploring options for future insurance coverage, including potential VA benefits due to his [employer].    Past Medical History:  Diagnosis Date   A-fib [facility])    Allergy     Anxiety    Arthritis    Cataract    Depression     Past Surgical History:  Procedure Laterality Date   BRAIN SURGERY  06/2005   Augmentation    COLON SURGERY  1998   Removal   COSMETIC SURGERY  2006   FRACTURE SURGERY  2010   iliostemy  03/2007   KIDNEY STONE SURGERY N/A    DR Charlie Crawley-- [facility]  urology   SHOULDER SURGERY Left    WRIST SURGERY Left     Family History  Problem Relation Age of Onset   Multiple myeloma Father    Cancer Father 53       multiple myeloma   Ovarian cancer Mother    Emphysema Mother    COPD Mother    Hypertension Mother    Obesity Mother    Depression Mother    Diabetes Mother    Cancer Mother 58       ovarian   Arthritis Brother    Anxiety disorder Other     Social History   Socioeconomic History   Marital status: Married    Spouse name: Not on file   Number of children: Not on file   Years of education: Not on file   Highest education level: Associate degree: occupational, Scientist, product/process development, or vocational program  Occupational History   Occupation: not working    Associate Professor: [employer]   Occupation: Tax inspector  Comment: on disability  Tobacco Use   Smoking status: Never   Smokeless tobacco: Never  Substance and Sexual Activity   Alcohol use: No   Drug use: No   Sexual activity: Not Currently    Partners: Male  Other Topics Concern   Not on file  Social History Narrative   Exercise--- WALKING 5X A WEEK   Social Drivers of Health   Financial Resource Strain: Low Risk  (04/21/2024)   Received from [facility]   Overall Financial Resource Strain (CARDIA)    Difficulty of Paying Living Expenses: Not hard at all  Food Insecurity: No Food Insecurity (04/21/2024)   Hunger Vital Sign    Worried About Running Out of Food in the Last Year: Never true    Ran Out of Food in the Last Year: Never true  Transportation Needs: No Transportation Needs (04/21/2024)   Received from [facility] - Transportation    Lack  of Transportation (Medical): No    Lack of Transportation (Non-Medical): No  Physical Activity: Insufficiently Active (04/21/2024)   Exercise Vital Sign    Days of Exercise per Week: 2 days    Minutes of Exercise per Session: 30 min  Stress: No Stress Concern Present (04/21/2024)   Received from [facility] - Occupational Stress Questionnaire    Feeling of Stress : Only a little  Social Connections: Socially Integrated (04/21/2024)   Received from [facility]   Social Network    How would you rate your social network (family, work, friends)?: Good participation with social networks  Recent Concern: Social Connections - Moderately Isolated (04/21/2024)   Social Connection and Isolation Panel    Frequency of Communication with Friends and Family: More than three times a week    Frequency of Social Gatherings with Friends and Family: Twice a week    Attends Religious Services: Patient declined    Database administrator or Organizations: No    Attends Banker Meetings: Never    Marital Status: Married  Catering manager Violence: Not At Risk (04/21/2024)   Received from [facility]   HITS    Over the last 12 months how often did your partner physically hurt you?: Never    Over the last 12 months how often did your partner insult you or talk down to you?: Never    Over the last 12 months how often did your partner threaten you with physical harm?: Never    Over the last 12 months how often did your partner scream or curse at you?: Never    Outpatient Medications Prior to Visit  Medication Sig Dispense Refill   apixaban (ELIQUIS) 5 MG TABS tablet Take by mouth.     clonazePAM  (KLONOPIN ) 1 MG tablet 1 po qhs prn 30 tablet 1   sodium bicarbonate 650 MG tablet Take 650 mg by mouth 2 (two) times daily.     triamcinolone  cream (KENALOG ) 0.1 % Apply 1 Application topically 2 (two) times daily. 30 g 0   ciclopirox  (PENLAC ) 8 % solution  Apply topically at bedtime. Apply over nail and surrounding skin. Apply daily over previous coat. After seven (7) days, may remove with alcohol and continue cycle. 6.6 mL 0   escitalopram  (LEXAPRO ) 20 MG tablet Take 1 tablet (20 mg total) by mouth daily. 90 tablet 0   methocarbamol  (ROBAXIN ) 500 MG tablet Take 1 tablet (500 mg total) by mouth every 8 (eight) hours as  needed for muscle spasms. 21 tablet 0   methylPREDNISolone  (MEDROL  DOSEPAK) 4 MG TBPK tablet Follow instructions on package. 21 tablet 0   No facility-administered medications prior to visit.    Allergies  Allergen Reactions   Estradiol Other (See Comments)    inflamed tissue    Codeine    Hydrocodone-Acetaminophen    Shingrix [Zoster Vac Recomb Adjuvanted]    Ciprofloxacin Hcl Itching    Possible allergy    Review of Systems  Constitutional:  Negative for chills, fever and malaise/fatigue.  HENT:  Negative for congestion and hearing loss.   Eyes:  Negative for blurred vision and discharge.  Respiratory:  Negative for cough, sputum production and shortness of breath.   Cardiovascular:  Negative for chest pain, palpitations and leg swelling.  Gastrointestinal:  Negative for abdominal pain, blood in stool, constipation, diarrhea, heartburn, nausea and vomiting.  Genitourinary:  Negative for dysuria, frequency, hematuria and urgency.  Musculoskeletal:  Negative for back pain, falls and myalgias.  Skin:  Negative for rash.  Neurological:  Negative for dizziness, sensory change, loss of consciousness, weakness and headaches.  Endo/Heme/Allergies:  Negative for environmental allergies. Does not bruise/bleed easily.  Psychiatric/Behavioral:  Negative for depression and suicidal ideas. The patient is not nervous/anxious and does not have insomnia.        Objective:    Physical Exam Vitals and nursing note reviewed.  Constitutional:      General: She is not in acute distress.    Appearance: Normal appearance. She is  well-developed.  HENT:     Head: Normocephalic and atraumatic.  Eyes:     General: No scleral icterus.       Right eye: No discharge.        Left eye: No discharge.  Cardiovascular:     Rate and Rhythm: Normal rate and regular rhythm.     Heart sounds: No murmur heard. Pulmonary:     Effort: Pulmonary effort is normal. No respiratory distress.     Breath sounds: Normal breath sounds.  Musculoskeletal:        General: Normal range of motion.     Cervical back: Normal range of motion and neck supple.     Right lower leg: No edema.     Left lower leg: No edema.  Skin:    General: Skin is warm and dry.  Neurological:     Mental Status: She is alert and oriented to person, place, and time.  Psychiatric:        Mood and Affect: Mood normal.        Behavior: Behavior normal.        Thought Content: Thought content normal.        Judgment: Judgment normal.     BP 120/88 (BP Location: Left Arm, Patient Position: Sitting, Cuff Size: Large)   Pulse (!) 50   Temp 97.9 F (36.6 C) (Oral)   Resp 16   Ht 5' 8 (1.727 m)   Wt 157 lb 6.4 oz (71.4 kg)   SpO2 98%   BMI 23.93 kg/m  Wt Readings from Last 3 Encounters:  08/25/24 157 lb 6.4 oz (71.4 kg)  07/31/24 154 lb (69.9 kg)  04/21/24 155 lb (70.3 kg)    Diabetic Foot Exam - Simple   No data filed    Lab Results  Component Value Date   WBC 6.7 08/19/2023   HGB 13.6 08/19/2023   HCT 42.5 08/19/2023   PLT 275.0 08/19/2023   GLUCOSE 98 08/19/2023  CHOL 207 (H) 08/19/2023   TRIG 75.0 08/19/2023   HDL 96.40 08/19/2023   LDLCALC 95 08/19/2023   ALT 16 08/19/2023   AST 21 08/19/2023   NA 140 08/19/2023   K 4.6 08/19/2023   CL 103 08/19/2023   CREATININE 0.66 08/19/2023   BUN 12 08/19/2023   CO2 31 08/19/2023   TSH 1.71 08/19/2023   HGBA1C 5.5 10/13/2010    Lab Results  Component Value Date   TSH 1.71 08/19/2023   Lab Results  Component Value Date   WBC 6.7 08/19/2023   HGB 13.6 08/19/2023   HCT 42.5 08/19/2023    MCV 92.2 08/19/2023   PLT 275.0 08/19/2023   Lab Results  Component Value Date   NA 140 08/19/2023   K 4.6 08/19/2023   CO2 31 08/19/2023   GLUCOSE 98 08/19/2023   BUN 12 08/19/2023   CREATININE 0.66 08/19/2023   BILITOT 0.6 08/19/2023   ALKPHOS 82 08/19/2023   AST 21 08/19/2023   ALT 16 08/19/2023   PROT 7.1 08/19/2023   ALBUMIN 4.5 08/19/2023   CALCIUM 10.2 08/19/2023   GFR 94.45 08/19/2023   Lab Results  Component Value Date   CHOL 207 (H) 08/19/2023   Lab Results  Component Value Date   HDL 96.40 08/19/2023   Lab Results  Component Value Date   LDLCALC 95 08/19/2023   Lab Results  Component Value Date   TRIG 75.0 08/19/2023   Lab Results  Component Value Date   CHOLHDL 2 08/19/2023   Lab Results  Component Value Date   HGBA1C 5.5 10/13/2010       Assessment & Plan:  Preventative health care Assessment & Plan: Ghm utd Check labs  See AVS Health Maintenance  Topic Date Due   HIV Screening  Never done   Cervical Cancer Screening (HPV/Pap Cotest)  04/19/2013   COVID-19 Vaccine (1) 09/10/2024 (Originally 11/10/1965)   Influenza Vaccine  02/02/2025 (Originally 06/05/2024)   Pneumococcal Vaccine: 50+ Years (1 of 1 - PCV) 08/25/2025 (Originally 11/11/2011)   Medicare Annual Wellness (AWV)  04/21/2025   Mammogram  02/17/2026   DTaP/Tdap/Td (3 - Td or Tdap) 08/18/2032   Hepatitis C Screening  Completed   Zoster Vaccines- Shingrix  Completed   Hepatitis B Vaccines 19-59 Average Risk  Aged Out   HPV VACCINES  Aged Out   Meningococcal B Vaccine  Aged Out   Colonoscopy  Discontinued     Orders: -     Comprehensive metabolic panel with GFR -     Lipid panel -     TSH -     CBC with Differential/Platelet  Onychomycosis -     Ciclopirox ; Apply topically at bedtime. Apply over nail and surrounding skin. Apply daily over previous coat. After seven (7) days, may remove with alcohol and continue cycle.  Dispense: 6.6 mL; Refill: 1 -     Comprehensive  metabolic panel with GFR  Anxiety -     Escitalopram  Oxalate; Take 1 tablet (20 mg total) by mouth daily.  Dispense: 90 tablet; Refill: 3 -     TSH  Estrogen deficiency -     DG Bone Density; Future   Assessment and Plan Assessment & Plan Vulvovaginal atrophy with dyspareunia   Dyspareunia is due to vulvovaginal atrophy. Previous topical cream treatment caused an allergic reaction, and hormonal treatments are contraindicated due to AFib. She uses Replens for vaginal moisture. Discuss alternative vaginal moisturizers with her gynecologist. Consider Almetta Planas laser treatment for  vulvovaginal atrophy, noting it is not covered by insurance.  Peripheral neuropathic symptoms (legs and feet)   Peripheral neuropathic symptoms in her legs and feet were initially suspected to be Guillain-Barre syndrome but have been ruled out. Symptoms have improved but persist mildly. A possible adverse reaction to the shingles vaccine was noted by the neurologist. Add this possible adverse reaction to the allergy list.  Anxiety disorder   She is prescribed Lexapro  for anxiety disorder. Ensure refills for Lexapro  are available at [facility].  Onychomycosis (tinea unguium)   Onychomycosis is being managed with Penlac . Ensure refills for Penlac  are available.   Saige Canton R Lowne Chase, DO

## 2024-08-25 NOTE — Assessment & Plan Note (Signed)
 Ghm utd Check labs  See AVS Health Maintenance  Topic Date Due   HIV Screening  Never done   Cervical Cancer Screening (HPV/Pap Cotest)  04/19/2013   COVID-19 Vaccine (1) 09/10/2024 (Originally 11/10/1965)   Influenza Vaccine  02/02/2025 (Originally 06/05/2024)   Pneumococcal Vaccine: 50+ Years (1 of 1 - PCV) 08/25/2025 (Originally 11/11/2011)   Medicare Annual Wellness (AWV)  04/21/2025   Mammogram  02/17/2026   DTaP/Tdap/Td (3 - Td or Tdap) 08/18/2032   Hepatitis C Screening  Completed   Zoster Vaccines- Shingrix  Completed   Hepatitis B Vaccines 19-59 Average Risk  Aged Out   HPV VACCINES  Aged Out   Meningococcal B Vaccine  Aged Out   Colonoscopy  Discontinued

## 2024-08-26 LAB — LIPID PANEL
Cholesterol: 193 mg/dL (ref 0–200)
HDL: 86.2 mg/dL (ref >39.00)
LDL Cholesterol: 91 mg/dL (ref 0–99)
NonHDL: 107.11
Total CHOL/HDL Ratio: 2
Triglycerides: 80 mg/dL (ref 0.0–149.0)
VLDL: 16 mg/dL (ref 0.0–40.0)

## 2024-08-26 LAB — CBC WITH DIFFERENTIAL/PLATELET
Basophils Absolute: 0.1 K/uL (ref 0.0–0.1)
Basophils Relative: 1.5 % (ref 0.0–3.0)
Eosinophils Absolute: 0.2 K/uL (ref 0.0–0.7)
Eosinophils Relative: 2.7 % (ref 0.0–5.0)
HCT: 41.1 % (ref 36.0–46.0)
Hemoglobin: 13.5 g/dL (ref 12.0–15.0)
Lymphocytes Relative: 28.8 % (ref 12.0–46.0)
Lymphs Abs: 1.7 K/uL (ref 0.7–4.0)
MCHC: 32.8 g/dL (ref 30.0–36.0)
MCV: 90.4 fl (ref 78.0–100.0)
Monocytes Absolute: 0.5 K/uL (ref 0.1–1.0)
Monocytes Relative: 7.7 % (ref 3.0–12.0)
Neutro Abs: 3.5 K/uL (ref 1.4–7.7)
Neutrophils Relative %: 59.3 % (ref 43.0–77.0)
Platelets: 245 K/uL (ref 150.0–400.0)
RBC: 4.55 Mil/uL (ref 3.87–5.11)
RDW: 14.4 % (ref 11.5–15.5)
WBC: 5.9 K/uL (ref 4.0–10.5)

## 2024-08-26 LAB — COMPREHENSIVE METABOLIC PANEL WITH GFR
ALT: 18 U/L (ref 0–35)
AST: 23 U/L (ref 0–37)
Albumin: 4.7 g/dL (ref 3.5–5.2)
Alkaline Phosphatase: 74 U/L (ref 39–117)
BUN: 8 mg/dL (ref 6–23)
CO2: 28 meq/L (ref 19–32)
Calcium: 9.8 mg/dL (ref 8.4–10.5)
Chloride: 102 meq/L (ref 96–112)
Creatinine, Ser: 0.71 mg/dL (ref 0.40–1.20)
GFR: 90.89 mL/min (ref >60.00)
Glucose, Bld: 86 mg/dL (ref 70–99)
Potassium: 4.2 meq/L (ref 3.5–5.1)
Sodium: 140 meq/L (ref 135–145)
Total Bilirubin: 0.7 mg/dL (ref 0.2–1.2)
Total Protein: 7 g/dL (ref 6.0–8.3)

## 2024-08-26 LAB — TSH: TSH: 2.03 u[IU]/mL (ref 0.35–5.50)

## 2024-08-30 ENCOUNTER — Ambulatory Visit: Payer: Self-pay | Admitting: Family Medicine

## 2024-10-08 ENCOUNTER — Ambulatory Visit (HOSPITAL_BASED_OUTPATIENT_CLINIC_OR_DEPARTMENT_OTHER)
Admission: RE | Admit: 2024-10-08 | Discharge: 2024-10-08 | Disposition: A | Discharge status: Home / Self Care | Source: Ambulatory Visit | Attending: Family Medicine | Admitting: Family Medicine

## 2024-10-08 DIAGNOSIS — E2839 Other primary ovarian failure: Secondary | ICD-10-CM | POA: Diagnosis present

## 2024-10-15 ENCOUNTER — Encounter: Payer: Self-pay | Admitting: Family Medicine

## 2024-10-15 MED ORDER — ALENDRONATE SODIUM 70 MG PO TABS: 70.0000 mg | ORAL_TABLET | ORAL | 3 refills | Status: AC

## 2024-10-15 NOTE — Addendum Note (Signed)
 Addended by: Liev Brockbank D on: 10/15/2024 10:31 AM   Modules accepted: Orders

## 2024-10-22 ENCOUNTER — Encounter: Payer: Self-pay | Admitting: Family Medicine

## 2025-04-27 ENCOUNTER — Ambulatory Visit

## 2025-08-26 ENCOUNTER — Encounter: Admitting: Family Medicine
